# Patient Record
Sex: Female | Born: 1995 | Race: White | Hispanic: No | Marital: Married | Smoking: Never smoker
Health system: Southern US, Community
[De-identification: ages and names within clinical notes are randomized; demographics above are authoritative.]

## PROBLEM LIST (undated history)

## (undated) DIAGNOSIS — T7840XA Allergy, unspecified, initial encounter: Secondary | ICD-10-CM

## (undated) DIAGNOSIS — F419 Anxiety disorder, unspecified: Secondary | ICD-10-CM

## (undated) DIAGNOSIS — F32A Depression, unspecified: Secondary | ICD-10-CM

## (undated) DIAGNOSIS — F988 Other specified behavioral and emotional disorders with onset usually occurring in childhood and adolescence: Secondary | ICD-10-CM

## (undated) DIAGNOSIS — F329 Major depressive disorder, single episode, unspecified: Secondary | ICD-10-CM

## (undated) DIAGNOSIS — Z8619 Personal history of other infectious and parasitic diseases: Secondary | ICD-10-CM

## (undated) HISTORY — DX: Major depressive disorder, single episode, unspecified: F32.9

## (undated) HISTORY — DX: Anxiety disorder, unspecified: F41.9

## (undated) HISTORY — DX: Depression, unspecified: F32.A

## (undated) HISTORY — DX: Allergy, unspecified, initial encounter: T78.40XA

## (undated) HISTORY — DX: Other specified behavioral and emotional disorders with onset usually occurring in childhood and adolescence: F98.8

## (undated) HISTORY — DX: Personal history of other infectious and parasitic diseases: Z86.19

---
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
## 2016-07-11 LAB — HM PAP SMEAR: HM PAP: NEGATIVE

## 2016-10-24 LAB — CBC AND DIFFERENTIAL
HEMATOCRIT: 47 — AB (ref 36–46)
HEMOGLOBIN: 15.1 (ref 12.0–16.0)
Platelets: 196 (ref 150–399)
WBC: 7.9

## 2016-10-24 LAB — BASIC METABOLIC PANEL
BUN: 11 (ref 4–21)
Creatinine: 1 (ref 0.5–1.1)
GLUCOSE: 82
Potassium: 4.5 (ref 3.4–5.3)
SODIUM: 140 (ref 137–147)

## 2016-10-24 LAB — HEPATIC FUNCTION PANEL
ALT: 17 (ref 7–35)
AST: 19 (ref 13–35)
Alkaline Phosphatase: 60 (ref 25–125)
BILIRUBIN, TOTAL: 0.6

## 2016-10-24 LAB — VITAMIN B12: Vitamin B-12: 343

## 2016-10-26 ENCOUNTER — Encounter (HOSPITAL_COMMUNITY): Payer: Self-pay | Admitting: Emergency Medicine

## 2016-10-26 ENCOUNTER — Emergency Department (HOSPITAL_COMMUNITY): Payer: No Typology Code available for payment source

## 2016-10-26 ENCOUNTER — Emergency Department (HOSPITAL_COMMUNITY)
Admission: EM | Admit: 2016-10-26 | Discharge: 2016-10-26 | Disposition: A | Discharge status: Home / Self Care | Payer: No Typology Code available for payment source | Attending: Emergency Medicine | Admitting: Emergency Medicine

## 2016-10-26 ENCOUNTER — Encounter: Payer: Self-pay | Admitting: Physician Assistant

## 2016-10-26 ENCOUNTER — Ambulatory Visit (INDEPENDENT_AMBULATORY_CARE_PROVIDER_SITE_OTHER): Payer: No Typology Code available for payment source | Admitting: Physician Assistant

## 2016-10-26 VITALS — BP 90/60 | HR 76 | Temp 98.4°F | Ht 63.0 in | Wt 135.0 lb

## 2016-10-26 DIAGNOSIS — R1012 Left upper quadrant pain: Secondary | ICD-10-CM | POA: Diagnosis not present

## 2016-10-26 DIAGNOSIS — R55 Syncope and collapse: Secondary | ICD-10-CM | POA: Diagnosis not present

## 2016-10-26 DIAGNOSIS — Z79899 Other long term (current) drug therapy: Secondary | ICD-10-CM | POA: Insufficient documentation

## 2016-10-26 LAB — COMPREHENSIVE METABOLIC PANEL
ALT: 17 U/L (ref 14–54)
ANION GAP: 8 (ref 5–15)
AST: 21 U/L (ref 15–41)
Albumin: 4.4 g/dL (ref 3.5–5.0)
Alkaline Phosphatase: 68 U/L (ref 38–126)
BILIRUBIN TOTAL: 0.5 mg/dL (ref 0.3–1.2)
BUN: 11 mg/dL (ref 6–20)
CO2: 23 mmol/L (ref 22–32)
Calcium: 9.4 mg/dL (ref 8.9–10.3)
Chloride: 108 mmol/L (ref 101–111)
Creatinine, Ser: 0.74 mg/dL (ref 0.44–1.00)
GFR calc Af Amer: >60 mL/min (ref >60)
Glucose, Bld: 82 mg/dL (ref 65–99)
POTASSIUM: 3.8 mmol/L (ref 3.5–5.1)
Sodium: 139 mmol/L (ref 135–145)
TOTAL PROTEIN: 7.4 g/dL (ref 6.5–8.1)

## 2016-10-26 LAB — URINALYSIS, ROUTINE W REFLEX MICROSCOPIC
Bilirubin Urine: NEGATIVE
Glucose, UA: NEGATIVE mg/dL
Hgb urine dipstick: NEGATIVE
KETONES UR: NEGATIVE mg/dL
LEUKOCYTES UA: NEGATIVE
NITRITE: NEGATIVE
PH: 5 (ref 5.0–8.0)
Protein, ur: NEGATIVE mg/dL
SPECIFIC GRAVITY, URINE: 1.013 (ref 1.005–1.030)

## 2016-10-26 LAB — CBC
HEMATOCRIT: 44.2 % (ref 36.0–46.0)
HEMOGLOBIN: 15.2 g/dL — AB (ref 12.0–15.0)
MCH: 32.1 pg (ref 26.0–34.0)
MCHC: 34.4 g/dL (ref 30.0–36.0)
MCV: 93.2 fL (ref 78.0–100.0)
Platelets: 190 10*3/uL (ref 150–400)
RBC: 4.74 MIL/uL (ref 3.87–5.11)
RDW: 12.9 % (ref 11.5–15.5)
WBC: 8.4 10*3/uL (ref 4.0–10.5)

## 2016-10-26 LAB — IRON AND TIBC
IRON: 72
TIBC: 373
UIBC: 301

## 2016-10-26 LAB — LIPASE: Lipase: 21

## 2016-10-26 LAB — ESTIMATED GFR: EGFR (Non-African Amer.): 74.187

## 2016-10-26 LAB — LIPASE, BLOOD: LIPASE: 25 U/L (ref 11–51)

## 2016-10-26 LAB — FERRITIN: Ferritin: 53.1

## 2016-10-26 LAB — POC URINE PREG, ED: Preg Test, Ur: NEGATIVE

## 2016-10-26 LAB — AMYLASE: AMYLASE: 55

## 2016-10-26 LAB — FOLATE: Folate: 13.68

## 2016-10-26 MED ORDER — ONDANSETRON HCL 4 MG PO TABS: 4.0000 mg | ORAL_TABLET | Freq: Four times a day (QID) | ORAL | 0 refills | Status: DC

## 2016-10-26 MED ORDER — NAPROXEN 375 MG PO TABS: 375.0000 mg | ORAL_TABLET | Freq: Two times a day (BID) | ORAL | 0 refills | Status: DC

## 2016-10-26 MED ORDER — IOPAMIDOL (ISOVUE-300) INJECTION 61%
INTRAVENOUS | Status: AC
  Filled 2016-10-26: qty 100

## 2016-10-26 MED ORDER — IOPAMIDOL (ISOVUE-300) INJECTION 61%
100.0000 mL | Freq: Once | INTRAVENOUS | Status: AC | PRN
  Administered 2016-10-26: 100 mL via INTRAVENOUS

## 2016-10-26 MED ORDER — SODIUM CHLORIDE 0.9 % IV BOLUS (SEPSIS)
1000.0000 mL | Freq: Once | INTRAVENOUS | Status: AC
  Administered 2016-10-26: 1000 mL via INTRAVENOUS

## 2016-10-26 MED ORDER — ONDANSETRON HCL 4 MG/2ML IJ SOLN
4.0000 mg | Freq: Once | INTRAMUSCULAR | Status: AC
  Administered 2016-10-26: 4 mg via INTRAVENOUS
  Filled 2016-10-26: qty 2

## 2016-10-26 NOTE — Progress Notes (Signed)
Hannah Roman is a 21 y.o. female here to establish care and had near syncope episode on 9/3.  I acted as a Neurosurgeonscribe for [employer] CorporationSamantha Demauri Advincula, PA-C Corky Mullonna Orphanos, LPN  History of Present Illness:   Chief Complaint  Patient presents with  . Establish Care    PHCS Multiplan  . Near Syncope    Acute Concerns: Near syncope -- [place] [employer], had eaten cereal prior to, was walking into the park had completed climbing one set of stairs and then felt like she was going to pass out. Felt fine the day before and the morning of.  Had significant nausea and dizziness, saw "black." Within a few minutes her vision returned to normal. Since then, she has been having diarrhea 3 x both days after leaving the park. Eating well since then. Continued dizziness, lightheadedness and some SOB with walking. Has been sleeping more than usual. Having significant pain in LUQ region and epigastric. Pain is worse with laying down and any sudden movements, just as jumping. Having headaches -- when walking and first thing in the morning. No slurred speech, weakness along one side of body or other stroke-like symptoms with HA. Advil relieves the HA. No blood in stool. Has had intermittent nausea. Since this episode she has been trying to drink more than usual, has had 2 bottles of water + 2 bottles of gatorade daily.  She went to an Urgent Care on both the day of her syncopal event and the next day. On Labor Day, they checked a urine and told her to return the next day because they were "about to close for the day." Labs completed the next day have been faxed to us, reveal normal CMP, amylase, lipase, CBC normal. Hgb normal. Urine did show trace leuks.  Chronic Issues: None  Health Maintenance: Weight -- Weight: 135 lb (61.2 kg)   Depression screen [facility] 2/9 10/26/2016  Decreased Interest 0  Down, Depressed, Hopeless 0  PHQ - 2 Score 0    No flowsheet data found.  Other  providers/specialists: Ob-Gyn    Past Medical History:  Diagnosis Date  . History of chicken pox      Social History   Social History  . Marital status: Single    Spouse name: N/A  . Number of children: N/A  . Years of education: N/A   Occupational History  . Not on file.   Social History Main Topics  . Smoking status: Never Smoker  . Smokeless tobacco: Never Used  . Alcohol use Yes     Comment: socially  . Drug use: No  . Sexual activity: Yes    Birth control/ protection: Condom   Other Topics Concern  . Not on file   Social History Narrative   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[employer]   Has boyfriend       Past Surgical History:  Procedure Laterality Date  . 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 Depression Father   . High Cholesterol Father   . Hypertension Father   . Cancer Maternal Grandmother   . Depression Maternal Grandmother   . Cancer Paternal Grandmother   . Cancer Paternal Grandfather     Allergies  Allergen Reactions  . Penicillins Hives     Current Medications:   Current Outpatient Prescriptions:  .  ibuprofen (ADVIL) 200 MG tablet, Take 400 mg  by mouth every 6 (six) hours as needed., Disp: , Rfl:    Review of Systems:   Review of Systems  Constitutional: Positive for malaise/fatigue. Negative for chills, fever and weight loss.  HENT: Negative for hearing loss, sinus pain and sore throat.   Eyes: Negative for blurred vision.  Respiratory: Negative for cough and shortness of breath.   Cardiovascular: Negative for chest pain, palpitations and leg swelling.  Gastrointestinal: Positive for diarrhea and nausea. Negative for abdominal pain, constipation, heartburn and vomiting.       Abdominal cramping started Tues morning.  Genitourinary: Negative for dysuria, frequency and urgency.  Musculoskeletal: Negative for back pain, myalgias and neck pain.  Skin: Negative for itching  and rash.  Neurological: Positive for dizziness and headaches. Negative for tingling, seizures and loss of consciousness.  Endo/Heme/Allergies: Negative for polydipsia.  Psychiatric/Behavioral: Negative for depression. The patient is not nervous/anxious.     Vitals:   Vitals:   10/26/16 0840  BP: 90/60  Pulse: 76  Temp: 98.4 F (36.9 C)  TempSrc: Oral  SpO2: 99%  Weight: 135 lb (61.2 kg)  Height:  (1.6 m)     Body mass index is 23.91 kg/m.   Orthostatic VS for the past 24 hrs (Last 3 readings):  BP- Lying Pulse- Lying BP- Sitting Pulse- Sitting BP- Standing at 0 minutes Pulse- Standing at 0 minutes  10/26/16 0926 106/70 88 98/70 78 90/60 85    Physical Exam:   Physical Exam  Constitutional: She appears well-developed. She is cooperative.  Non-toxic appearance. She does not have a sickly appearance. She does not appear ill. No distress.  Cardiovascular: Normal rate, regular rhythm, S1 normal, S2 normal, normal heart sounds and normal pulses.   No LE edema  Pulmonary/Chest: Effort normal and breath sounds normal.  Abdominal: Normal appearance and bowel sounds are normal. There is tenderness in the epigastric area and left upper quadrant. There is guarding (LUQ). There is no rigidity, no rebound, no CVA tenderness, no tenderness at McBurney's point and negative Murphy's sign.  Endorses significant abdominal pain when asked to jump  Neurological: She is alert. She has normal strength. No cranial nerve deficit or sensory deficit. Coordination and gait normal. GCS eye subscore is 4. GCS verbal subscore is 5. GCS motor subscore is 6.  Reflex Scores:      Patellar reflexes are 2+ on the right side and 2+ on the left side. Skin: Skin is warm, dry and intact.  Psychiatric: She has a normal mood and affect. Her speech is normal and behavior is normal.  Nursing note and vitals reviewed.  EKG tracing is personally reviewed.  EKG notes NSR.  No acute changes.    Assessment and  Plan:    Mozel was seen today for establish care and near syncope.  Diagnoses and all orders for this visit:  Near syncope and left upper quadrant pain EKG tracing is personally reviewed.  EKG notes NSR.  No acute changes. Orthostatics negative. Blood pressure remains low for her. She has continued dizziness, headaches, fatigue, diarrhea and worsening LUQ and epigastric pain since her incident. We discussed option of ordering labs here, pushing PO fluids, and obtaining stat CT as an outpatient vs going to the ED for further evaluation and treatment. She is agreeable to going to the ER. Offered EMS transportation however she declined. She is planning to go to [facility] for further care.  -     EKG 12-Lead  Encounter for screening for  HIV -     Cancel: HIV antibody     . Reviewed expectations re: course of current medical issues. . Discussed self-management of symptoms. . Outlined signs and symptoms indicating need for more acute intervention. . Patient verbalized understanding and all questions were answered. . See orders for this visit as documented in the electronic medical record. . Patient received an After-Visit Summary.   CMA or LPN served as scribe during this visit. History, Physical, and Plan performed by medical provider. Documentation and orders reviewed and attested to.   Jarold Motto, PA-C

## 2016-10-26 NOTE — ED Provider Notes (Signed)
[facility]-EMERGENCY DEPT Provider Note   CSN: [id number] Arrival date & time: 10/26/16  1004     History   Chief Complaint Chief Complaint  Patient presents with  . Abdominal Pain    HPI Hannah Roman is a 21 y.o. female.  HPI Pt has been abdominal pain since Monday.  She had a syncopal episode on Monday.  She has been having trouble with dizziness, headaches, fatigue and lightheadedness since then.  She has been nauseated but no vomiting.  She has been having diarrhea, 2-3 times per day.  No cough, no sore throat.  No dysuria.  No vaginal bleeding or discharge.  She has pain in the llq. It increases with walking and moving. Past Medical History:  Diagnosis Date  . Depression   . History of chicken pox     There are no active problems to display for this patient.   Past Surgical History:  Procedure Laterality Date  . 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 Authorizing Provider  naproxen sodium (ANAPROX) 220 MG tablet Take 440 mg by mouth 2 (two) times daily with a meal.   Yes [provider]  naproxen (NAPROSYN) 375 MG tablet Take 1 tablet (375 mg total) by mouth 2 (two) times daily. 10/26/16   Linwood Dibbles, MD  ondansetron (ZOFRAN) 4 MG tablet Take 1 tablet (4 mg total) by mouth every 6 (six) hours. 10/26/16   Linwood Dibbles, MD    Family History Family History  Problem Relation Age of Onset  . Depression Father   . High Cholesterol Father   . Hypertension Father   . Cancer Maternal Grandmother   . Depression Maternal Grandmother   . Cancer Paternal Grandmother   . Cancer Paternal Grandfather     Social History Social History  Substance Use Topics  . Smoking status: Never Smoker  . Smokeless tobacco: Never Used  . Alcohol use Yes     Comment: socially     Allergies   Penicillins   Review  of Systems Review of Systems  All other systems reviewed and are negative.    Physical Exam Updated Vital Signs BP 104/72 (BP Location: Right Arm) Comment: Simultaneous filing. User may not have seen previous data.  Pulse 72 Comment: Simultaneous filing. User may not have seen previous data.  Temp 98 F (36.7 C)   Resp 20   LMP 09/25/2016 Comment: neg preg test 10/26/16  SpO2 100%   Physical Exam  Constitutional: She appears well-developed and well-nourished. No distress.  HENT:  Head: Normocephalic and atraumatic.  Right Ear: External ear normal.  Left Ear: External ear normal.  Eyes: Conjunctivae are normal. Right eye exhibits no discharge. Left eye exhibits no discharge. No scleral icterus.  Neck: Neck supple. No tracheal deviation present.  Cardiovascular: Normal rate, regular rhythm and intact distal pulses.   Pulmonary/Chest: Effort normal and breath sounds normal. No stridor. No respiratory distress. She has no wheezes. She has no rales.  Abdominal: Soft. Bowel sounds are normal. She exhibits no distension. There is tenderness in the left upper quadrant. There is no rebound and no guarding. No hernia.  Musculoskeletal: She exhibits no edema or tenderness.  Neurological: She is alert. She has normal strength. No cranial nerve deficit (no facial droop, extraocular movements  intact, no slurred speech) or sensory deficit. She exhibits normal muscle tone. She displays no seizure activity. Coordination normal.  Skin: Skin is warm and dry. No rash noted.  Psychiatric: She has a normal mood and affect.  Nursing note and vitals reviewed.    ED Treatments / Results  Labs (all labs ordered are listed, but only abnormal results are displayed) Labs Reviewed  CBC - Abnormal; Notable for the following:       Result Value   Hemoglobin 15.2 (*)    All other components within normal limits  LIPASE, BLOOD  COMPREHENSIVE METABOLIC PANEL  URINALYSIS, ROUTINE W REFLEX MICROSCOPIC  POC  URINE PREG, ED     Radiology Ct Abdomen Pelvis W Contrast  Result Date: 10/26/2016 CLINICAL DATA:  21 year old female with a history of left-sided abdominal pain for 2 days EXAM: CT ABDOMEN AND PELVIS WITH CONTRAST TECHNIQUE: Multidetector CT imaging of the abdomen and pelvis was performed using the standard protocol following bolus administration of intravenous contrast. CONTRAST:  ISOVUE-300 IOPAMIDOL (ISOVUE-300) INJECTION 61% COMPARISON:  None. FINDINGS: Lower chest: No acute abnormality. Hepatobiliary: No focal liver abnormality is seen. No gallstones, gallbladder wall thickening, or biliary dilatation. Pancreas: Unremarkable. No pancreatic ductal dilatation or surrounding inflammatory changes. Spleen: Normal in size without focal abnormality. Adrenals/Urinary Tract: Unremarkable appearance of the adrenal glands. Right kidney unremarkable with no hydronephrosis or nephrolithiasis. No right-sided perinephric stranding. Unremarkable course of the right ureter. The left kidney demonstrates a few regions of cortical thinning involving superior cortex, posterior cortex, and inferior cortex. No inflammatory changes. No hydronephrosis or nephrolithiasis. Unremarkable course of the left ureter. There is a 6 mm hyperdense focus at the dependent aspect of the urinary bladder, to the right of the midline. Stomach/Bowel: Unremarkable stomach, small bowel, colon. Normal appendix. Vascular/Lymphatic: Unremarkable appearance of the vasculature. Reproductive: Small amount of fluid within the endometrial canal. Likely physiologic changes of the bilateral at adnexa. Other: No abdominal wall hernia. Musculoskeletal: No displaced fracture. IMPRESSION: There are no CT findings to account for the patient's left-sided abdominal pain. There is a 6 mm hyperdense focus at the dependent aspect of the urinary bladder, which may represent a bladder stone or recently passed kidney stone. No evidence of left or right-sided  nephrolithiasis or hydronephrosis. The left kidney demonstrates a few regions of cortical thinning/ scarring. This may reflect prior episodes of infection or infarction, and a correlation with prior history of UTI and/ or pyelonephritis may be useful. Physiologic changes of the uterus and adnexa. Electronically Signed   By: Gilmer Mor D.O.   On: 10/26/2016 16:25    Procedures Procedures (including critical care time)  Medications Ordered in ED Medications  iopamidol (ISOVUE-300) 61 % injection (not administered)  sodium chloride 0.9 % bolus 1,000 mL (1,000 mLs Intravenous New Bag/Given 10/26/16 1617)  ondansetron (ZOFRAN) injection 4 mg (4 mg Intravenous Given 10/26/16 1618)  iopamidol (ISOVUE-300) 61 % injection 100 mL (100 mLs Intravenous Contrast Given 10/26/16 1602)     Initial Impression / Assessment and Plan / ED Course  I have reviewed the triage vital signs and the nursing notes.  Pertinent labs & imaging results that were available during my care of the patient were reviewed by me and considered in my medical decision making (see chart for details).  Clinical Course as of Oct 26 1721  Thu Oct 26, 2016  1702 No acute findings on ct scan.  Possible recently passed stone?  [JK]    Clinical Course User Index [JK]  Linwood DibblesKnapp, Naia Ruff, MD   Pt presents with a few days of abdominal pain. Labs are reassuring. Persistent pain in the luq.  CT scan done to evaluate further.  No acute findings noted.   It is possible she recently passed a stone.  Dc home with NSAID and antinausea medications.  Follow up with PCP  Final Clinical Impressions(s) / ED Diagnoses   Final diagnoses:  Left upper quadrant pain    New Prescriptions New Prescriptions   NAPROXEN (NAPROSYN) 375 MG TABLET    Take 1 tablet (375 mg total) by mouth 2 (two) times daily.   ONDANSETRON (ZOFRAN) 4 MG TABLET    Take 1 tablet (4 mg total) by mouth every 6 (six) hours.     Linwood DibblesKnapp, Clancy Leiner, MD 10/26/16 [phone]

## 2016-10-26 NOTE — ED Notes (Signed)
ED Provider at bedside. 

## 2016-10-26 NOTE — Patient Instructions (Signed)
[facility]  Tell them you are having severe abdominal pain -- hurts to walk, move and lay down, and it is getting worse with time. Tell them you have been having intermittent dizziness with headaches and that your blood pressure has been abnormally low. You will likely need IV fluids and they can administer them there.  Follow-up with us at your discretion after your visit to the emergency department.

## 2016-10-26 NOTE — ED Triage Notes (Signed)
Pt reports L side abd pain for the past 2 days. Went to pcp for same, and was told to come here for further eval. No emesis. Has had nausea and diarrhea. Has had diarrhea 3x in past 24 hours.

## 2016-10-26 NOTE — ED Notes (Signed)
Discharge instructions reviewed with patient. Patient verbalizes understanding. VSS.   

## 2016-11-03 ENCOUNTER — Telehealth: Payer: Self-pay | Admitting: Physician Assistant

## 2016-11-03 NOTE — Telephone Encounter (Signed)
ROI faxed to [employer] @ [place]

## 2016-11-30 ENCOUNTER — Encounter: Payer: Self-pay | Admitting: Physician Assistant

## 2016-11-30 LAB — GC/CHLAMYDIA PROBE AMP
CHLAMYDIA BY NAA: NEGATIVE
Chlamydia by NAA: NEGATIVE
GC PROBE AMP, URINE: NEGATIVE
Neisseria Gonorrhea: NEGATIVE

## 2016-12-01 ENCOUNTER — Encounter: Payer: Self-pay | Admitting: Physician Assistant

## 2016-12-01 DIAGNOSIS — F329 Major depressive disorder, single episode, unspecified: Secondary | ICD-10-CM | POA: Insufficient documentation

## 2016-12-01 DIAGNOSIS — F419 Anxiety disorder, unspecified: Secondary | ICD-10-CM | POA: Insufficient documentation

## 2016-12-01 DIAGNOSIS — F32A Depression, unspecified: Secondary | ICD-10-CM | POA: Insufficient documentation

## 2016-12-07 DIAGNOSIS — R51 Headache: Secondary | ICD-10-CM | POA: Insufficient documentation

## 2016-12-07 DIAGNOSIS — Y92410 Unspecified street and highway as the place of occurrence of the external cause: Secondary | ICD-10-CM | POA: Diagnosis not present

## 2016-12-07 DIAGNOSIS — R071 Chest pain on breathing: Secondary | ICD-10-CM | POA: Diagnosis not present

## 2016-12-07 DIAGNOSIS — Y999 Unspecified external cause status: Secondary | ICD-10-CM | POA: Insufficient documentation

## 2016-12-07 DIAGNOSIS — M25551 Pain in right hip: Secondary | ICD-10-CM | POA: Diagnosis not present

## 2016-12-07 DIAGNOSIS — Y9389 Activity, other specified: Secondary | ICD-10-CM | POA: Insufficient documentation

## 2016-12-07 DIAGNOSIS — M546 Pain in thoracic spine: Secondary | ICD-10-CM | POA: Insufficient documentation

## 2016-12-07 DIAGNOSIS — S8001XA Contusion of right knee, initial encounter: Secondary | ICD-10-CM | POA: Insufficient documentation

## 2016-12-07 DIAGNOSIS — S161XXA Strain of muscle, fascia and tendon at neck level, initial encounter: Secondary | ICD-10-CM | POA: Diagnosis not present

## 2016-12-07 DIAGNOSIS — S199XXA Unspecified injury of neck, initial encounter: Secondary | ICD-10-CM | POA: Diagnosis present

## 2016-12-07 DIAGNOSIS — S20312A Abrasion of left front wall of thorax, initial encounter: Secondary | ICD-10-CM | POA: Insufficient documentation

## 2016-12-07 MED ORDER — IBUPROFEN 400 MG PO TABS
ORAL_TABLET | ORAL | Status: AC
  Administered 2016-12-07: 400 mg via ORAL
  Filled 2016-12-07: qty 1

## 2016-12-07 MED ORDER — IBUPROFEN 400 MG PO TABS
400.0000 mg | ORAL_TABLET | Freq: Once | ORAL | Status: AC | PRN
  Administered 2016-12-07: 400 mg via ORAL
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
## 2016-12-08 ENCOUNTER — Emergency Department (HOSPITAL_COMMUNITY): Payer: No Typology Code available for payment source

## 2016-12-08 ENCOUNTER — Emergency Department (HOSPITAL_COMMUNITY)
Admission: EM | Admit: 2016-12-08 | Discharge: 2016-12-08 | Disposition: A | Discharge status: Home / Self Care | Payer: No Typology Code available for payment source | Attending: Emergency Medicine | Admitting: Emergency Medicine

## 2016-12-08 DIAGNOSIS — S20312A Abrasion of left front wall of thorax, initial encounter: Secondary | ICD-10-CM

## 2016-12-08 DIAGNOSIS — S161XXA Strain of muscle, fascia and tendon at neck level, initial encounter: Secondary | ICD-10-CM

## 2016-12-08 DIAGNOSIS — S8001XA Contusion of right knee, initial encounter: Secondary | ICD-10-CM

## 2016-12-08 DIAGNOSIS — M546 Pain in thoracic spine: Secondary | ICD-10-CM

## 2016-12-08 LAB — I-STAT CHEM 8, ED
BUN: 10 mg/dL (ref 6–20)
CALCIUM ION: 1.24 mmol/L (ref 1.15–1.40)
CHLORIDE: 105 mmol/L (ref 101–111)
CREATININE: 0.7 mg/dL (ref 0.44–1.00)
GLUCOSE: 104 mg/dL — AB (ref 65–99)
HCT: 41 % (ref 36.0–46.0)
Hemoglobin: 13.9 g/dL (ref 12.0–15.0)
Potassium: 3.2 mmol/L — ABNORMAL LOW (ref 3.5–5.1)
Sodium: 142 mmol/L (ref 135–145)
TCO2: 23 mmol/L (ref 22–32)

## 2016-12-08 LAB — I-STAT BETA HCG BLOOD, ED (MC, WL, AP ONLY)

## 2016-12-08 MED ORDER — METHOCARBAMOL 500 MG PO TABS: 500.0000 mg | ORAL_TABLET | Freq: Two times a day (BID) | ORAL | 0 refills | Status: DC | PRN

## 2016-12-08 MED ORDER — IOPAMIDOL (ISOVUE-300) INJECTION 61%
INTRAVENOUS | Status: AC
  Administered 2016-12-08: 75 mL
  Filled 2016-12-08: qty 75

## 2016-12-08 MED ORDER — IBUPROFEN 600 MG PO TABS: 600.0000 mg | ORAL_TABLET | Freq: Four times a day (QID) | ORAL | 0 refills | Status: DC | PRN

## 2016-12-08 MED ORDER — SODIUM CHLORIDE 0.9 % IV BOLUS (SEPSIS)
1000.0000 mL | Freq: Once | INTRAVENOUS | Status: AC
  Administered 2016-12-08: 1000 mL via INTRAVENOUS
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
## 2017-01-04 ENCOUNTER — Telehealth: Payer: Self-pay | Admitting: Physician Assistant

## 2017-01-04 ENCOUNTER — Ambulatory Visit: Payer: No Typology Code available for payment source | Admitting: Family Medicine
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
## 2017-01-04 NOTE — Telephone Encounter (Signed)
Pt was on hold [place] came back and discussed pt with me, told Diannia RuderKara to schedule pt to come in today with Dr. Jimmey RalphParker. Diannia RuderKara said appt scheduled for 2:40 with Dr. Jimmey RalphParker.

## 2017-01-04 NOTE — Telephone Encounter (Signed)
Called and spoke to pt, asked her how she was feeling any heart palpitations now? Pt said no she only gets them when she has panic attacks along with SOB. Pt said she has had 4 panic attacks this week and her anxiety it is starting to inter fear with school. Pt said this is not an urgent matter and she is fine right now and would prefer to see her PCP. Told pt okay I will cancel appt this afternoon with Jimmey RalphParker and what is a good time to schedule [facility] I have 8:00, 8:30, 9:00 or afternoon? Pt said 8:00 AM. Told pt okay see you then if you develop severe SOB or chest pains please go to the ED. Pt verbalized understanding.

## 2017-01-04 NOTE — Telephone Encounter (Signed)
Noted, see other message. Pt coming in tomorrow to see PCP.
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
## 2017-01-05 ENCOUNTER — Encounter: Payer: Self-pay | Admitting: Physician Assistant

## 2017-01-05 ENCOUNTER — Ambulatory Visit (INDEPENDENT_AMBULATORY_CARE_PROVIDER_SITE_OTHER): Payer: No Typology Code available for payment source | Admitting: Physician Assistant

## 2017-01-05 VITALS — BP 100/60 | HR 87 | Temp 98.3°F | Ht 64.0 in | Wt 135.0 lb

## 2017-01-05 DIAGNOSIS — Z114 Encounter for screening for human immunodeficiency virus [HIV]: Secondary | ICD-10-CM | POA: Diagnosis not present

## 2017-01-05 DIAGNOSIS — F419 Anxiety disorder, unspecified: Secondary | ICD-10-CM | POA: Diagnosis not present

## 2017-01-05 LAB — CBC WITH DIFFERENTIAL/PLATELET
BASOS PCT: 0.7 % (ref 0.0–3.0)
Basophils Absolute: 0 10*3/uL (ref 0.0–0.1)
EOS PCT: 3.5 % (ref 0.0–5.0)
Eosinophils Absolute: 0.2 10*3/uL (ref 0.0–0.7)
HCT: 44.7 % (ref 36.0–46.0)
Hemoglobin: 15.1 g/dL — ABNORMAL HIGH (ref 12.0–15.0)
LYMPHS ABS: 2 10*3/uL (ref 0.7–4.0)
Lymphocytes Relative: 32.7 % (ref 12.0–46.0)
MCHC: 33.8 g/dL (ref 30.0–36.0)
MCV: 96.2 fl (ref 78.0–100.0)
MONO ABS: 0.5 10*3/uL (ref 0.1–1.0)
MONOS PCT: 8.4 % (ref 3.0–12.0)
NEUTROS ABS: 3.3 10*3/uL (ref 1.4–7.7)
NEUTROS PCT: 54.7 % (ref 43.0–77.0)
Platelets: 202 10*3/uL (ref 150.0–400.0)
RBC: 4.65 Mil/uL (ref 3.87–5.11)
RDW: 13.2 % (ref 11.5–15.5)
WBC: 6.1 10*3/uL (ref 4.0–10.5)

## 2017-01-05 LAB — COMPREHENSIVE METABOLIC PANEL
ALK PHOS: 57 U/L (ref 39–117)
ALT: 16 U/L (ref 0–35)
AST: 17 U/L (ref 0–37)
Albumin: 4.4 g/dL (ref 3.5–5.2)
BUN: 14 mg/dL (ref 6–23)
CHLORIDE: 105 meq/L (ref 96–112)
CO2: 27 mEq/L (ref 19–32)
Calcium: 9.9 mg/dL (ref 8.4–10.5)
Creatinine, Ser: 0.79 mg/dL (ref 0.40–1.20)
GFR: 97.18 mL/min (ref >60.00)
GLUCOSE: 89 mg/dL (ref 70–99)
POTASSIUM: 4.1 meq/L (ref 3.5–5.1)
Sodium: 140 mEq/L (ref 135–145)
TOTAL PROTEIN: 7 g/dL (ref 6.0–8.3)
Total Bilirubin: 0.6 mg/dL (ref 0.2–1.2)

## 2017-01-05 LAB — TSH: TSH: 0.78 u[IU]/mL (ref 0.35–4.50)

## 2017-01-05 MED ORDER — ESCITALOPRAM OXALATE 10 MG PO TABS: 10.0000 mg | ORAL_TABLET | Freq: Every day | ORAL | 1 refills | Status: DC

## 2017-01-05 MED ORDER — CLONAZEPAM 0.5 MG PO TABS: 0.5000 mg | ORAL_TABLET | Freq: Two times a day (BID) | ORAL | 0 refills | Status: DC | PRN
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
## 2017-01-05 NOTE — Patient Instructions (Signed)
It was great to see you!  Start Lexapro 10 mg daily.  Take Klonopin as needed for panic attacks only, may cause excessive sedation so do not drive on this medication.  We will call you with your lab results.  Follow-up with Dr. Helane RimaErica Wallace while I am out.

## 2017-01-06 LAB — HIV ANTIBODY (ROUTINE TESTING W REFLEX): HIV: NONREACTIVE

## 2017-01-08 NOTE — Telephone Encounter (Signed)
[facility] [place]

## 2017-01-09 NOTE — Progress Notes (Signed)
Labs all normal. Please inform patient.  Katina Degreealeb M. Jimmey RalphParker, MD 01/09/2017 11:14 AM

## 2017-01-15 ENCOUNTER — Encounter: Payer: Self-pay | Admitting: Physician Assistant

## 2017-01-15 LAB — GC/CHLAMYDIA PROBE AMP, URINE
CHLAMYDIA PROBE AMP: NEGATIVE
GC PROBE AMP, URINE: NEGATIVE

## 2017-02-16 ENCOUNTER — Ambulatory Visit: Payer: No Typology Code available for payment source | Admitting: Family Medicine

## 2017-02-22 ENCOUNTER — Ambulatory Visit: Payer: No Typology Code available for payment source | Admitting: Family Medicine

## 2017-02-28 ENCOUNTER — Encounter: Payer: Self-pay | Admitting: Family Medicine

## 2017-02-28 ENCOUNTER — Ambulatory Visit (INDEPENDENT_AMBULATORY_CARE_PROVIDER_SITE_OTHER): Payer: No Typology Code available for payment source | Admitting: Family Medicine

## 2017-02-28 VITALS — BP 104/66 | HR 77 | Temp 98.2°F | Ht 64.0 in | Wt 133.8 lb

## 2017-02-28 DIAGNOSIS — F329 Major depressive disorder, single episode, unspecified: Secondary | ICD-10-CM | POA: Diagnosis not present

## 2017-02-28 DIAGNOSIS — F419 Anxiety disorder, unspecified: Secondary | ICD-10-CM | POA: Diagnosis not present

## 2017-02-28 MED ORDER — ALPRAZOLAM 0.5 MG PO TABS: 0.5000 mg | ORAL_TABLET | Freq: Every day | ORAL | 0 refills | Status: DC | PRN

## 2017-02-28 MED ORDER — ESCITALOPRAM OXALATE 10 MG PO TABS: 10.0000 mg | ORAL_TABLET | Freq: Every day | ORAL | 1 refills | Status: DC

## 2017-02-28 NOTE — Patient Instructions (Signed)
It was so nice to meet you!  Follow up with Samantha in 6 months.

## 2017-02-28 NOTE — Progress Notes (Signed)
Hannah Roman is a 22 y.o. female is here for follow up.  History of Present Illness:   HPI: See Assessment and Plan section for Problem Based Charting of issues discussed today.  There are no preventive care reminders to display for this patient.   GAD 7 : Generalized Anxiety Score 02/28/2017 01/05/2017  Nervous, Anxious, on Edge 3 2  Control/stop worrying 3 3  Worry too much - different things 3 3  Trouble relaxing 3 3  Restless 2 1  Easily annoyed or irritable 2 3  Afraid - awful might happen 2 3  Total GAD 7 Score 18 18  Anxiety Difficulty - Somewhat difficult   Depression screen [facility] 2/9 02/28/2017 10/26/2016  Decreased Interest 2 0  Down, Depressed, Hopeless 3 0  PHQ - 2 Score 5 0  Altered sleeping 2 -  Tired, decreased energy 2 -  Change in appetite 0 -  Feeling bad or failure about yourself  3 -  Trouble concentrating 2 -  Moving slowly or fidgety/restless 2 -  Suicidal thoughts 0 -  PHQ-9 Score 16 -   PMHx, SurgHx, SocialHx, FamHx, Medications, and Allergies were reviewed in the Visit Navigator and updated as appropriate.   Patient Active Problem List   Diagnosis Date Noted  . Anxiety   . Depression    Social History   Tobacco Use  . Smoking status: Never Smoker  . Smokeless tobacco: Never Used  Substance Use Topics  . Alcohol use: Yes    Comment: socially  . Drug use: No   Current Medications and Allergies:   .  escitalopram (LEXAPRO) 10 MG tablet, Take 1 tablet (10 mg total) by mouth at bedtime., Disp: 90 tablet, Rfl: 1   Allergies  Allergen Reactions  . Penicillins Hives    Childhood reaction    Review of Systems   Pertinent items are noted in the HPI. Otherwise, ROS is negative.  Vitals:   Vitals:   02/28/17 0844  BP: 104/66  Pulse: 77  Temp: 98.2 F (36.8 C)  TempSrc: Oral  SpO2: 99%  Weight: 133 lb 12.8 oz (60.7 kg)  Height: 5\' 4"  (1.626 m)     Body mass index is 22.97 kg/m.   Physical Exam:   Physical Exam    Constitutional: She is oriented to person, place, and time. She appears well-developed and well-nourished. No distress.  HENT:  Head: Normocephalic and atraumatic.  Right Ear: External ear normal.  Left Ear: External ear normal.  Nose: Nose normal.  Mouth/Throat: Oropharynx is clear and moist.  Eyes: Conjunctivae and EOM are normal. Pupils are equal, round, and reactive to light.  Neck: Normal range of motion. Neck supple. No thyromegaly present.  Cardiovascular: Normal rate, regular rhythm, normal heart sounds and intact distal pulses.  Pulmonary/Chest: Effort normal and breath sounds normal.  Abdominal: Soft. Bowel sounds are normal.  Musculoskeletal: Normal range of motion.  Lymphadenopathy:    She has no cervical adenopathy.  Neurological: She is alert and oriented to person, place, and time.  Skin: Skin is warm and dry. Capillary refill takes less than 2 seconds.  Psychiatric: She has a normal mood and affect. Her behavior is normal.  Nursing note and vitals reviewed.   Assessment and Plan:   Hannah Roman was seen today for follow-up.  Diagnoses and all orders for this visit:  Anxiety and depression Comments: HPI: Patient follows up for medication visit for anxiety and depression.  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  At her last visit with Jarold MottoSamantha Worley, she scored high on PHQ 9 and gad 7 questionnaires.  Lexapro was started every morning.  Klonopin was provided for panic attacks to be used sparingly.  The patient states that the Lexapro has been helpful, but causes some sedation throughout the day.  Her depressive symptoms have improved somewhat.  See PHQ 9 above.  She admits that her issues are worse during the school year.  She did try the Klonopin for panic attacks but felt that it was too sedating for her.  Because dizziness, sedation, and lasted through the day.  She was given Xanax in the past and this was noted at  the last visit.  She states that 10 pills would last at least 6 months.  Assessment and plan: Improvement in symptoms.  Patient will start taking Lexapro at night.  I did give her a Xanax prescription of 20 pills.  She will keep half in her purse and half at home.  She has a self-directed goal of having this last 1 year. Orders: -     escitalopram (LEXAPRO) 10 MG tablet; Take 1 tablet (10 mg total) by mouth at bedtime. -     ALPRAZolam (XANAX) 0.5 MG tablet; Take 1 tablet (0.5 mg total) by mouth daily as needed for anxiety.  . Reviewed expectations re: course of current medical issues. . Discussed self-management of symptoms. . Outlined signs and symptoms indicating need for more acute intervention. . Patient verbalized understanding and all questions were answered. Marland Kitchen. Health Maintenance issues including appropriate healthy diet, exercise, and smoking avoidance were discussed with patient. . See orders for this visit as documented in the electronic medical record. . Patient received an After Visit Summary.  Helane RimaErica Brandan Glauber, DO [place] [place] 02/28/2017  Future Appointments  Date Time Provider Department Center  08/28/2017  9:00 AM Helane RimaWallace, Shantil Vallejo, DO [facility]   Records requested if needed. Time spent with the patient: 25 minutes, of which >50% was spent in obtaining information about her symptoms, reviewing her previous labs, evaluations, and treatments, counseling her about her condition (please see the discussed topics above), and developing a plan to further investigate it; she had a number of questions which I addressed.

## 2017-08-27 NOTE — Progress Notes (Deleted)
   Hannah Roman is a 22 y.o. female is here for follow up.  History of Present Illness:   HPI: There are no preventive care reminders to display for this patient. [facility] 02/28/2017 10/26/2016  Decreased Interest 2 0  Down, Depressed, Hopeless 3 0  PHQ - 2 Score 5 0  Altered sleeping 2 -  Tired, decreased energy 2 -  Change in appetite 0 -  Feeling bad or failure about yourself  3 -  Trouble concentrating 2 -  Moving slowly or fidgety/restless 2 -  Suicidal thoughts 0 -  PHQ-9 Score 16 -   PMHx, SurgHx, SocialHx, FamHx, Medications, and Allergies were reviewed in the Visit Navigator and updated as appropriate.   Patient Active Problem List   Diagnosis Date Noted  . Anxiety   . Depression    Social History   Tobacco Use  . Smoking status: Never Smoker  . Smokeless tobacco: Never Used  Substance Use Topics  . Alcohol use: Yes    Comment: socially  . Drug use: No   Current Medications and Allergies:   Current Outpatient Medications:  .  ALPRAZolam (XANAX) 0.5 MG tablet, Take 1 tablet (0.5 mg total) by mouth daily as needed for anxiety., Disp: 20 tablet, Rfl: 0 .  escitalopram (LEXAPRO) 10 MG tablet, Take 1 tablet (10 mg total) by mouth at bedtime., Disp: 90 tablet, Rfl: 1  Allergies  Allergen Reactions  . Penicillins Hives    Childhood reaction    Review of Systems   Pertinent items are noted in the HPI. Otherwise, ROS is negative.  Vitals:  There were no vitals filed for this visit.   There is no height or weight on file to calculate BMI.  Physical Exam:   Physical Exam Results for orders placed or performed in visit on 01/15/17  HM PAP SMEAR  Result Value Ref Range   HM Pap smear NILM/ Negative   GC/chlamydia probe amp, urine  Result Value Ref Range   GC Probe Amp, Urine Negative    Chlamydia Probe Amp Negative     Assessment and Plan:   There are no diagnoses linked to this encounter.  . Reviewed expectations re: course of  current medical issues. . Discussed self-management of symptoms. . Outlined signs and symptoms indicating need for more acute intervention. . Patient verbalized understanding and all questions were answered. Marland Kitchen. Health Maintenance issues including appropriate healthy diet, exercise, and smoking avoidance were discussed with patient. . See orders for this visit as documented in the electronic medical record. . Patient received an After Visit Summary.  Helane RimaErica Rayen Palen, DO [place] 08/27/2017  Future Appointments  Date Time Provider Department Center  08/28/2017  4:20 PM Helane RimaWallace, Gurveer Colucci, DO [facility]    *** CMA served as scribe during this visit. History, Physical, and Plan performed by medical provider. The above documentation has been reviewed and is accurate and complete. Helane RimaErica Beckham Capistran, D.O.

## 2017-08-28 ENCOUNTER — Ambulatory Visit: Payer: No Typology Code available for payment source | Admitting: Family Medicine

## 2017-09-04 ENCOUNTER — Ambulatory Visit: Payer: No Typology Code available for payment source | Admitting: Family Medicine

## 2017-09-04 NOTE — Progress Notes (Deleted)
   Hannah Roman is a 22 y.o. female is here for follow up.  History of Present Illness:   HPI: There are no preventive care reminders to display for this patient. [facility] 02/28/2017 10/26/2016  Decreased Interest 2 0  Down, Depressed, Hopeless 3 0  PHQ - 2 Score 5 0  Altered sleeping 2 -  Tired, decreased energy 2 -  Change in appetite 0 -  Feeling bad or failure about yourself  3 -  Trouble concentrating 2 -  Moving slowly or fidgety/restless 2 -  Suicidal thoughts 0 -  PHQ-9 Score 16 -   PMHx, SurgHx, SocialHx, FamHx, Medications, and Allergies were reviewed in the Visit Navigator and updated as appropriate.   Patient Active Problem List   Diagnosis Date Noted  . Anxiety   . Depression    Social History   Tobacco Use  . Smoking status: Never Smoker  . Smokeless tobacco: Never Used  Substance Use Topics  . Alcohol use: Yes    Comment: socially  . Drug use: No   Current Medications and Allergies:   Current Outpatient Medications:  .  ALPRAZolam (XANAX) 0.5 MG tablet, Take 1 tablet (0.5 mg total) by mouth daily as needed for anxiety., Disp: 20 tablet, Rfl: 0 .  escitalopram (LEXAPRO) 10 MG tablet, Take 1 tablet (10 mg total) by mouth at bedtime., Disp: 90 tablet, Rfl: 1  Allergies  Allergen Reactions  . Penicillins Hives    Childhood reaction    Review of Systems   Pertinent items are noted in the HPI. Otherwise, ROS is negative.  Vitals:  There were no vitals filed for this visit.   There is no height or weight on file to calculate BMI.  Physical Exam:   Physical Exam Results for orders placed or performed in visit on 01/15/17  HM PAP SMEAR  Result Value Ref Range   HM Pap smear NILM/ Negative   GC/chlamydia probe amp, urine  Result Value Ref Range   GC Probe Amp, Urine Negative    Chlamydia Probe Amp Negative     Assessment and Plan:   There are no diagnoses linked to this encounter.  . Reviewed expectations re: course of  current medical issues. . Discussed self-management of symptoms. . Outlined signs and symptoms indicating need for more acute intervention. . Patient verbalized understanding and all questions were answered. Marland Kitchen. Health Maintenance issues including appropriate healthy diet, exercise, and smoking avoidance were discussed with patient. . See orders for this visit as documented in the electronic medical record. . Patient received an After Visit Summary.  Helane RimaErica Johnattan Strassman, DO [place] 09/04/2017  Future Appointments  Date Time Provider Department Center  09/04/2017  4:20 PM Helane RimaWallace, Lakyn Mantione, DO [facility]    *** CMA served as scribe during this visit. History, Physical, and Plan performed by medical provider. The above documentation has been reviewed and is accurate and complete. Helane RimaErica Anjelika Ausburn, D.O.

## 2017-09-07 ENCOUNTER — Ambulatory Visit (INDEPENDENT_AMBULATORY_CARE_PROVIDER_SITE_OTHER): Payer: No Typology Code available for payment source | Admitting: Family Medicine

## 2017-09-07 ENCOUNTER — Encounter: Payer: Self-pay | Admitting: Family Medicine

## 2017-09-07 VITALS — BP 102/64 | HR 93 | Temp 99.3°F | Ht 64.0 in | Wt 132.0 lb

## 2017-09-07 DIAGNOSIS — F419 Anxiety disorder, unspecified: Secondary | ICD-10-CM | POA: Diagnosis not present

## 2017-09-07 DIAGNOSIS — F341 Dysthymic disorder: Secondary | ICD-10-CM | POA: Diagnosis not present

## 2017-09-07 MED ORDER — FLUOXETINE HCL 20 MG PO CAPS: 20.0000 mg | ORAL_CAPSULE | Freq: Every day | ORAL | 3 refills | Status: DC

## 2017-09-07 NOTE — Progress Notes (Signed)
Hannah Roman is a 22 y.o. female is here for follow up.  History of Present Illness:   Hannah Roman, CMA acting as scribe for Dr. Helane RimaErica Soua Roman.   HPI: Patient in office for follow up. She had to stop the lexapro. It gave her abdominal pain and she felt "very low" on it. Symptoms stopped after she discontinued it. She does take the xanax as needed and it helps a lot. She does not need refill at this time still has about five from last prescription.   There are no preventive care reminders to display for this patient.   [facility] 09/07/2017 02/28/2017 10/26/2016  Decreased Interest 1 2 0  Down, Depressed, Hopeless 1 3 0  PHQ - 2 Score 2 5 0  Altered sleeping 1 2 -  Tired, decreased energy 1 2 -  Change in appetite 0 0 -  Feeling bad or failure about yourself  1 3 -  Trouble concentrating 2 2 -  Moving slowly or fidgety/restless 0 2 -  Suicidal thoughts 0 0 -  PHQ-9 Score 7 16 -  Difficult doing work/chores Very difficult - -   PMHx, SurgHx, SocialHx, FamHx, Medications, and Allergies were reviewed in the Visit Navigator and updated as appropriate.   Patient Active Problem List   Diagnosis Date Noted  . Anxiety   . Depression    Social History   Tobacco Use  . Smoking status: Never Smoker  . Smokeless tobacco: Never Used  Substance Use Topics  . Alcohol use: Yes    Comment: socially  . Drug use: No   Current Medications and Allergies:   Current Outpatient Medications:  .  ALPRAZolam (XANAX) 0.5 MG tablet, Take 1 tablet (0.5 mg total) by mouth daily as needed for anxiety., Disp: 20 tablet, Rfl: 0 .  FLUoxetine (PROZAC) 20 MG capsule, Take 1 capsule (20 mg total) by mouth at bedtime., Disp: 90 capsule, Rfl: 3   Allergies  Allergen Reactions  . Penicillins Hives    Childhood reaction   . Lexapro [Escitalopram Oxalate]     GI symptoms    Review of Systems   Pertinent items are noted in the HPI. Otherwise, ROS is negative.  Vitals:    Vitals:   09/07/17 1423  BP: 102/64  Pulse: 93  Temp: 99.3 F (37.4 C)  TempSrc: Oral  SpO2: 99%  Weight: 132 lb (59.9 kg)  Height: 5\' 4"  (1.626 m)     Body mass index is 22.66 kg/m.  Physical Exam:   Physical Exam  Constitutional: She appears well-nourished.  HENT:  Head: Normocephalic and atraumatic.  Eyes: Pupils are equal, round, and reactive to light. EOM are normal.  Neck: Normal range of motion. Neck supple.  Cardiovascular: Normal rate, regular rhythm, normal heart sounds and intact distal pulses.  Pulmonary/Chest: Effort normal.  Abdominal: Soft.  Skin: Skin is warm.  Psychiatric: She has a normal mood and affect. Her behavior is normal.  Nursing note and vitals reviewed.    Assessment and Plan:   Hannah Roman was seen today for follow-up.  Diagnoses and all orders for this visit:  Anxiety Comments: Doing well. Psychology major with weekly therapy as well. Working hard on coping mechanisms. Okay refill of Xanax when she calls.   Dysthymia Comments: She is interested in trialing another medication prior to starting school in the fall. See below.  Orders: -     FLUoxetine (PROZAC) 20 MG capsule; Take 1 capsule (20 mg total) by mouth  at bedtime.    . Reviewed expectations re: course of current medical issues. . Discussed self-management of symptoms. . Outlined signs and symptoms indicating need for more acute intervention. . Patient verbalized understanding and all questions were answered. Marland Kitchen Health Maintenance issues including appropriate healthy diet, exercise, and smoking avoidance were discussed with patient. . See orders for this visit as documented in the electronic medical record. . Patient received an After Visit Summary.  Helane Rima, DO [place] 09/08/2017  Future Appointments  Date Time Provider Department Center  10/08/2017  8:00 AM Hannah Motto, PA [facility]    CMA served as scribe during this visit. History,  Physical, and Plan performed by medical provider. The above documentation has been reviewed and is accurate and complete. Helane Rima, D.O.

## 2017-09-08 ENCOUNTER — Encounter: Payer: Self-pay | Admitting: Family Medicine

## 2017-09-11 ENCOUNTER — Encounter: Payer: Self-pay | Admitting: Surgical

## 2017-10-08 ENCOUNTER — Encounter: Payer: No Typology Code available for payment source | Admitting: Physician Assistant

## 2017-10-08 ENCOUNTER — Ambulatory Visit (INDEPENDENT_AMBULATORY_CARE_PROVIDER_SITE_OTHER): Payer: No Typology Code available for payment source | Admitting: Physician Assistant

## 2017-10-08 ENCOUNTER — Encounter: Payer: Self-pay | Admitting: Physician Assistant

## 2017-10-08 VITALS — BP 106/62 | HR 84 | Temp 98.4°F | Ht 64.0 in | Wt 132.4 lb

## 2017-10-08 DIAGNOSIS — F419 Anxiety disorder, unspecified: Secondary | ICD-10-CM

## 2017-10-08 DIAGNOSIS — Z0000 Encounter for general adult medical examination without abnormal findings: Secondary | ICD-10-CM

## 2017-10-08 DIAGNOSIS — F329 Major depressive disorder, single episode, unspecified: Secondary | ICD-10-CM

## 2017-10-08 LAB — COMPREHENSIVE METABOLIC PANEL
ALT: 18 U/L (ref 0–35)
AST: 17 U/L (ref 0–37)
Albumin: 4.3 g/dL (ref 3.5–5.2)
Alkaline Phosphatase: 58 U/L (ref 39–117)
BILIRUBIN TOTAL: 0.5 mg/dL (ref 0.2–1.2)
BUN: 15 mg/dL (ref 6–23)
CALCIUM: 9.9 mg/dL (ref 8.4–10.5)
CHLORIDE: 107 meq/L (ref 96–112)
CO2: 27 meq/L (ref 19–32)
CREATININE: 0.95 mg/dL (ref 0.40–1.20)
GFR: 78 mL/min (ref >60.00)
Glucose, Bld: 76 mg/dL (ref 70–99)
Potassium: 4.3 mEq/L (ref 3.5–5.1)
SODIUM: 140 meq/L (ref 135–145)
Total Protein: 6.9 g/dL (ref 6.0–8.3)

## 2017-10-08 LAB — CBC
HEMATOCRIT: 43.6 % (ref 36.0–46.0)
Hemoglobin: 14.5 g/dL (ref 12.0–15.0)
MCHC: 33.4 g/dL (ref 30.0–36.0)
MCV: 95.5 fl (ref 78.0–100.0)
Platelets: 194 10*3/uL (ref 150.0–400.0)
RBC: 4.56 Mil/uL (ref 3.87–5.11)
RDW: 13.5 % (ref 11.5–15.5)
WBC: 5 10*3/uL (ref 4.0–10.5)

## 2017-10-08 MED ORDER — ALPRAZOLAM 0.5 MG PO TABS: 0.5000 mg | ORAL_TABLET | Freq: Every day | ORAL | 0 refills | Status: DC | PRN

## 2017-10-08 NOTE — Patient Instructions (Addendum)
It was great to see you!  If needed, may increase Prozac to 40 mg daily. If you do this consistently for 4 weeks and find it is benefitting you, please let us know and we can send in an updated prescription.  Let's follow-up in 6 months, sooner if you have concerns.  Take care,  [place] Maintenance, Female Adopting a healthy lifestyle and getting preventive care can go a long way to promote health and wellness. Talk with your health care provider about what schedule of regular examinations is right for you. This is a good chance for you to check in with your provider about disease prevention and staying healthy. In between checkups, there are plenty of things you can do on your own. Experts have done a lot of research about which lifestyle changes and preventive measures are most likely to keep you healthy. Ask your health care provider for more information. Weight and diet Eat a healthy diet  Be sure to include plenty of vegetables, fruits, low-fat dairy products, and lean protein.  Do not eat a lot of foods high in solid fats, added sugars, or salt.  Get regular exercise. This is one of the most important things you can do for your health. ? Most adults should exercise for at least 150 minutes each week. The exercise should increase your heart rate and make you sweat (moderate-intensity exercise). ? Most adults should also do strengthening exercises at least twice a week. This is in addition to the moderate-intensity exercise.  Maintain a healthy weight  Body mass index (BMI) is a measurement that can be used to identify possible weight problems. It estimates body fat based on height and weight. Your health care provider can help determine your BMI and help you achieve or maintain a healthy weight.  For females 42 years of age and older: ? A BMI below 18.5 is considered underweight. ? A BMI of 18.5 to 24.9 is normal. ? A BMI of 25 to 29.9 is considered  overweight. ? A BMI of 30 and above is considered obese.  Watch levels of cholesterol and blood lipids  You should start having your blood tested for lipids and cholesterol at 22 years of age, then have this test every 5 years.  You may need to have your cholesterol levels checked more often if: ? Your lipid or cholesterol levels are high. ? You are older than 22 years of age. ? You are at high risk for heart disease.  Cancer screening Lung Cancer  Lung cancer screening is recommended for adults 50-76 years old who are at high risk for lung cancer because of a history of smoking.  A yearly low-dose CT scan of the lungs is recommended for people who: ? Currently smoke. ? Have quit within the past 15 years. ? Have at least a 30-pack-year history of smoking. A pack year is smoking an average of one pack of cigarettes a day for 1 year.  Yearly screening should continue until it has been 15 years since you quit.  Yearly screening should stop if you develop a health problem that would prevent you from having lung cancer treatment.  Breast Cancer  Practice breast self-awareness. This means understanding how your breasts normally appear and feel.  It also means doing regular breast self-exams. Let your health care provider know about any changes, no matter how small.  If you are in your 20s or 30s, you should have a clinical breast exam (CBE)  by a health care provider every 1-3 years as part of a regular health exam.  If you are 34 or older, have a CBE every year. Also consider having a breast X-ray (mammogram) every year.  If you have a family history of breast cancer, talk to your health care provider about genetic screening.  If you are at high risk for breast cancer, talk to your health care provider about having an MRI and a mammogram every year.  Breast cancer gene (BRCA) assessment is recommended for women who have family members with BRCA-related cancers. BRCA-related cancers  include: ? Breast. ? Ovarian. ? Tubal. ? Peritoneal cancers.  Results of the assessment will determine the need for genetic counseling and BRCA1 and BRCA2 testing.  Cervical Cancer Your health care provider may recommend that you be screened regularly for cancer of the pelvic organs (ovaries, uterus, and vagina). This screening involves a pelvic examination, including checking for microscopic changes to the surface of your cervix (Pap test). You may be encouraged to have this screening done every 3 years, beginning at age 61.  For women ages 75-65, health care providers may recommend pelvic exams and Pap testing every 3 years, or they may recommend the Pap and pelvic exam, combined with testing for human papilloma virus (HPV), every 5 years. Some types of HPV increase your risk of cervical cancer. Testing for HPV may also be done on women of any age with unclear Pap test results.  Other health care providers may not recommend any screening for nonpregnant women who are considered low risk for pelvic cancer and who do not have symptoms. Ask your health care provider if a screening pelvic exam is right for you.  If you have had past treatment for cervical cancer or a condition that could lead to cancer, you need Pap tests and screening for cancer for at least 20 years after your treatment. If Pap tests have been discontinued, your risk factors (such as having a new sexual partner) need to be reassessed to determine if screening should resume. Some women have medical problems that increase the chance of getting cervical cancer. In these cases, your health care provider may recommend more frequent screening and Pap tests.  Colorectal Cancer  This type of cancer can be detected and often prevented.  Routine colorectal cancer screening usually begins at 22 years of age and continues through 22 years of age.  Your health care provider may recommend screening at an earlier age if you have risk factors  for colon cancer.  Your health care provider may also recommend using home test kits to check for hidden blood in the stool.  A small camera at the end of a tube can be used to examine your colon directly (sigmoidoscopy or colonoscopy). This is done to check for the earliest forms of colorectal cancer.  Routine screening usually begins at age 50.  Direct examination of the colon should be repeated every 5-10 years through 22 years of age. However, you may need to be screened more often if early forms of precancerous polyps or small growths are found.  Skin Cancer  Check your skin from head to toe regularly.  Tell your health care provider about any new moles or changes in moles, especially if there is a change in a mole's shape or color.  Also tell your health care provider if you have a mole that is larger than the size of a pencil eraser.  Always use sunscreen. Apply sunscreen liberally and  repeatedly throughout the day.  Protect yourself by wearing long sleeves, pants, a wide-brimmed hat, and sunglasses whenever you are outside.  Heart disease, diabetes, and high blood pressure  High blood pressure causes heart disease and increases the risk of stroke. High blood pressure is more likely to develop in: ? People who have blood pressure in the high end of the normal range (130-139/85-89 mm Hg). ? People who are overweight or obese. ? People who are African American.  If you are 87-47 years of age, have your blood pressure checked every 3-5 years. If you are 46 years of age or older, have your blood pressure checked every year. You should have your blood pressure measured twice-once when you are at a hospital or clinic, and once when you are not at a hospital or clinic. Record the average of the two measurements. To check your blood pressure when you are not at a hospital or clinic, you can use: ? An automated blood pressure machine at a pharmacy. ? A home blood pressure monitor.  If  you are between 25 years and 51 years old, ask your health care provider if you should take aspirin to prevent strokes.  Have regular diabetes screenings. This involves taking a blood sample to check your fasting blood sugar level. ? If you are at a normal weight and have a low risk for diabetes, have this test once every three years after 22 years of age. ? If you are overweight and have a high risk for diabetes, consider being tested at a younger age or more often. Preventing infection Hepatitis B  If you have a higher risk for hepatitis B, you should be screened for this virus. You are considered at high risk for hepatitis B if: ? You were born in a country where hepatitis B is common. Ask your health care provider which countries are considered high risk. ? Your parents were born in a high-risk country, and you have not been immunized against hepatitis B (hepatitis B vaccine). ? You have HIV or AIDS. ? You use needles to inject street drugs. ? You live with someone who has hepatitis B. ? You have had sex with someone who has hepatitis B. ? You get hemodialysis treatment. ? You take certain medicines for conditions, including cancer, organ transplantation, and autoimmune conditions.  Hepatitis C  Blood testing is recommended for: ? Everyone born from [birth year] through 1965. ? Anyone with known risk factors for hepatitis C.  Sexually transmitted infections (STIs)  You should be screened for sexually transmitted infections (STIs) including gonorrhea and chlamydia if: ? You are sexually active and are younger than 22 years of age. ? You are older than 22 years of age and your health care provider tells you that you are at risk for this type of infection. ? Your sexual activity has changed since you were last screened and you are at an increased risk for chlamydia or gonorrhea. Ask your health care provider if you are at risk.  If you do not have HIV, but are at risk, it may be recommended  that you take a prescription medicine daily to prevent HIV infection. This is called pre-exposure prophylaxis (PrEP). You are considered at risk if: ? You are sexually active and do not regularly use condoms or know the HIV status of your partner(s). ? You take drugs by injection. ? You are sexually active with a partner who has HIV.  Talk with your health care provider about whether you  are at high risk of being infected with HIV. If you choose to begin PrEP, you should first be tested for HIV. You should then be tested every 3 months for as long as you are taking PrEP. Pregnancy  If you are premenopausal and you may become pregnant, ask your health care provider about preconception counseling.  If you may become pregnant, take 400 to 800 micrograms (mcg) of folic acid every day.  If you want to prevent pregnancy, talk to your health care provider about birth control (contraception). Osteoporosis and menopause  Osteoporosis is a disease in which the bones lose minerals and strength with aging. This can result in serious bone fractures. Your risk for osteoporosis can be identified using a bone density scan.  If you are 70 years of age or older, or if you are at risk for osteoporosis and fractures, ask your health care provider if you should be screened.  Ask your health care provider whether you should take a calcium or vitamin D supplement to lower your risk for osteoporosis.  Menopause may have certain physical symptoms and risks.  Hormone replacement therapy may reduce some of these symptoms and risks. Talk to your health care provider about whether hormone replacement therapy is right for you. Follow these instructions at home:  Schedule regular health, dental, and eye exams.  Stay current with your immunizations.  Do not use any tobacco products including cigarettes, chewing tobacco, or electronic cigarettes.  If you are pregnant, do not drink alcohol.  If you are  breastfeeding, limit how much and how often you drink alcohol.  Limit alcohol intake to no more than 1 drink per day for nonpregnant women. One drink equals 12 ounces of beer, 5 ounces of wine, or 1 ounces of hard liquor.  Do not use street drugs.  Do not share needles.  Ask your health care provider for help if you need support or information about quitting drugs.  Tell your health care provider if you often feel depressed.  Tell your health care provider if you have ever been abused or do not feel safe at home. This information is not intended to replace advice given to you by your health care provider. Make sure you discuss any questions you have with your health care provider. Document Released: 08/22/2010 Document Revised: 07/15/2015 Document Reviewed: 11/10/2014 Elsevier Interactive Patient Education  [employer].

## 2017-10-08 NOTE — Progress Notes (Signed)
Subjective:    Hannah Roman is a 22 y.o. female and is here for a comprehensive physical exam.  HPI  There are no preventive care reminders to display for this patient.  Acute Concerns: None  Chronic Issues: Anxiety and Depression -- was last in our office 1 month ago for follow-up on anxiety and depression. She was started on Prozac 20 mg. She has been doing well on this. Denies side effects that she usually has with other medications, such as abdominal pain or "feeling very low." Denies any SI/HI. Counseling every other week.  Health Maintenance: Immunizations -- UTD Diet -- stable, no significant changes Caffeine intake -- 1 cup daily Sleep habits -- "so so", having trouble staying asleep Exercise -- minimal Current Weight -- Weight: 132 lb 6.4 oz (60.1 kg)  Weight History: Wt Readings from Last 10 Encounters:  10/08/17 132 lb 6.4 oz (60.1 kg)  09/07/17 132 lb (59.9 kg)  02/28/17 133 lb 12.8 oz (60.7 kg)  01/05/17 135 lb (61.2 kg)  12/07/16 139 lb (63 kg)  10/26/16 135 lb (61.2 kg)  Mood -- okay Patient's last menstrual period was 10/03/2017. Period characteristics -- lasts a few days Birth control -- not on any  Depression screen PHQ 2/9 10/08/2017  Decreased Interest 1  Down, Depressed, Hopeless 1  PHQ - 2 Score 2  Altered sleeping 1  Tired, decreased energy 1  Change in appetite 0  Feeling bad or failure about yourself  0  Trouble concentrating 2  Moving slowly or fidgety/restless 0  Suicidal thoughts 0  PHQ-9 Score 6  Difficult doing work/chores Somewhat difficult    Other providers/specialists: Eye doctor and dentist in [place]  PMHx, SurgHx, SocialHx, Medications, and Allergies were reviewed in the Visit Navigator and updated as appropriate.   Past Medical History:  Diagnosis Date  . Anxiety   . Depression   . History of chicken pox      Past Surgical History:  Procedure Laterality Date  . 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 Depression Father   . High Cholesterol Father   . Hypertension Father   . Cancer Maternal Grandmother   . Depression Maternal Grandmother   . Breast cancer Paternal Grandmother   . Lung cancer Paternal Grandfather   . Brain cancer Paternal Aunt [id number]  . Colon cancer Neg Hx     Social History   Tobacco Use  . Smoking status: Never Smoker  . Smokeless tobacco: Never Used  Substance Use Topics  . Alcohol use: Yes    Comment: socially  . Drug use: No    Review of Systems:   Review of Systems  Constitutional: Negative for chills, fever, malaise/fatigue and weight loss.  HENT: Negative for hearing loss, sinus pain and sore throat.   Respiratory: Negative for cough and hemoptysis.   Cardiovascular: Negative for chest pain, palpitations, leg swelling and PND.  Gastrointestinal: Negative for abdominal pain, constipation, diarrhea, heartburn, nausea and vomiting.  Genitourinary: Negative for dysuria, frequency and urgency.  Musculoskeletal: Negative for back pain, myalgias and neck pain.  Skin: Negative for itching and rash.  Neurological: Negative for dizziness, tingling, seizures and headaches.  Endo/Heme/Allergies: Negative for polydipsia.  Psychiatric/Behavioral: Negative for depression. The patient is nervous/anxious.     Objective:   BP 106/62 (BP Location: Left Arm, Patient Position: Sitting, Cuff Size: Normal)   Pulse 84  Temp 98.4 F (36.9 C) (Oral)   Ht 5\' 4"  (1.626 m)   Wt 132 lb 6.4 oz (60.1 kg)   LMP 10/03/2017   SpO2 99%   BMI 22.73 kg/m   General Appearance:    Alert, cooperative, no distress, appears stated age  Head:    Normocephalic, without obvious abnormality, atraumatic  Eyes:    PERRL, conjunctiva/corneas clear, EOM's intact, fundi    benign, both eyes  Ears:    Normal TM's and external ear canals, both ears  Nose:   Nares normal, septum midline, mucosa normal, no drainage    or  sinus tenderness  Throat:   Lips, mucosa, and tongue normal; teeth and gums normal  Neck:   Supple, symmetrical, trachea midline, no adenopathy;    thyroid:  no enlargement/tenderness/nodules; no carotid   bruit or JVD  Back:     Symmetric, no curvature, ROM normal, no CVA tenderness  Lungs:     Clear to auscultation bilaterally, respirations unlabored  Chest Wall:    No tenderness or deformity   Heart:    Regular rate and rhythm, S1 and S2 normal, no murmur, rub   or gallop  Breast Exam:    Deferred  Abdomen:     Soft, non-tender, bowel sounds active all four quadrants,    no masses, no organomegaly  Genitalia:    Deferred  Rectal:    Deferred  Extremities:   Extremities normal, atraumatic, no cyanosis or edema  Pulses:   2+ and symmetric all extremities  Skin:   Skin color, texture, turgor normal, no rashes or lesions  Lymph nodes:   Cervical, supraclavicular, and axillary nodes normal  Neurologic:   CNII-XII intact, normal strength, sensation and reflexes    throughout    Assessment/Plan:   Problem List Items Addressed This Visit      Other   Anxiety and depression    Currently overall controlled. She is considering increasing to Prozac 40 mg if needed after school starts (starts tomorrow.) [place] Controlled Substance Database reviewed today regarding patient.  Patient is compliant with [facility] regarding pharmacy use and one-prescribing provider. It is appropriate to continue current medication regimen.        Relevant Medications   ALPRAZolam (XANAX) 0.5 MG tablet   Other Relevant Orders   Comprehensive metabolic panel   CBC    Other Visit Diagnoses    Routine physical examination    -  Primary Today patient counseled on age appropriate routine health concerns for screening and prevention, each reviewed and up to date or declined. Immunizations reviewed and up to date or declined. Labs ordered and reviewed. Risk factors for depression reviewed and negative. Hearing function and  visual acuity are intact. ADLs screened and addressed as needed. Functional ability and level of safety reviewed and appropriate. Education, counseling and referrals performed based on assessed risks today. Patient provided with a copy of personalized plan for preventive services.       Well Adult Exam: Labs ordered: Yes. Patient counseling was done. See below for items discussed. Discussed the patient's BMI. The BMI BMI is in the acceptable range Follow up in 6 months.  Patient Counseling:   [x]     Nutrition: Stressed importance of moderation in sodium/caffeine intake, saturated fat and cholesterol, caloric balance, sufficient intake of fresh fruits, vegetables, fiber, calcium, iron, and 1 mg of folate supplement per day (for females capable of pregnancy).   [x]      Stressed the importance of regular exercise.    [  x]    Substance Abuse: Discussed cessation/primary prevention of tobacco, alcohol, or other drug use; driving or other dangerous activities under the influence; availability of treatment for abuse.    [x]      Injury prevention: Discussed safety belts, safety helmets, smoke detector, smoking near bedding or upholstery.    [x]      Sexuality: Discussed sexually transmitted diseases, partner selection, use of condoms, avoidance of unintended pregnancy  and contraceptive alternatives.    [x]     Dental health: Discussed importance of regular tooth brushing, flossing, and dental visits.   [x]      Health maintenance and immunizations reviewed. Please refer to Health maintenance section.    Jarold Motto, PA-C [facility]

## 2017-10-08 NOTE — Assessment & Plan Note (Signed)
Currently overall controlled. She is considering increasing to Prozac 40 mg if needed after school starts (starts tomorrow.) [place] Controlled Substance Database reviewed today regarding patient.  Patient is compliant with [facility] regarding pharmacy use and one-prescribing provider. It is appropriate to continue current medication regimen.
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
## 2018-04-10 ENCOUNTER — Ambulatory Visit: Payer: No Typology Code available for payment source | Admitting: Physician Assistant

## 2018-05-28 ENCOUNTER — Telehealth: Payer: Self-pay | Admitting: *Deleted

## 2018-05-28 NOTE — Telephone Encounter (Signed)
Needs f/u for Anxiety

## 2018-07-02 ENCOUNTER — Encounter: Payer: Self-pay | Admitting: Family Medicine

## 2018-07-02 ENCOUNTER — Telehealth: Payer: No Typology Code available for payment source | Admitting: Physician Assistant

## 2018-07-02 DIAGNOSIS — J039 Acute tonsillitis, unspecified: Secondary | ICD-10-CM | POA: Diagnosis not present

## 2018-07-02 MED ORDER — AZITHROMYCIN 250 MG PO TABS: ORAL_TABLET | ORAL | 0 refills | Status: DC

## 2018-07-02 NOTE — Progress Notes (Signed)
We are sorry that you are not feeling well.  Here is how we plan to help!  Based on what you have shared with me it is likely that you have strep pharyngitis.  Strep pharyngitis is inflammation and infection in the back of the throat.  This is an infection cause by bacteria and is treated with antibiotics.  I have prescribed Azithromycin 250 mg two tablets today and then one daily for 4 additional days. For throat pain, we recommend over the counter oral pain relief medications such as acetaminophen or aspirin, or anti-inflammatory medications such as ibuprofen or naproxen sodium. Topical treatments such as oral throat lozenges or sprays may be used as needed. Strep infections are not as easily transmitted as other respiratory infections, however we still recommend that you avoid close contact with loved ones, especially the very young and elderly. Remember to wash your hands thoroughly throughout the day as this is the number one way to prevent the spread of infection and wipe down door knobs and counters with disinfectant.   Home Care: Only take medications as instructed by your medical team. Complete the entire course of an antibiotic. Do not take these medications with alcohol. A steam or ultrasonic humidifier can help congestion.  You can place a towel over your head and breathe in the steam from hot water coming from a faucet. Avoid close contacts especially the very young and the elderly. Cover your mouth when you cough or sneeze. Always remember to wash your hands.  Get Help Right Away If: You develop worsening fever or sinus pain. You develop a severe head ache or visual changes. Your symptoms persist after you have completed your treatment plan.  Make sure you Understand these instructions. Will watch your condition. Will get help right away if you are not doing well or get worse.  Your e-visit answers were reviewed by a board certified advanced clinical practitioner to complete  your personal care plan.  Depending on the condition, your plan could have included both over the counter or prescription medications.  If there is a problem please reply once you have received a response from your provider.  Your safety is important to us.  If you have drug allergies check your prescription carefully.    You can use MyChart to ask questions about today's visit, request a non-urgent call back, or ask for a work or school excuse for 24 hours related to this e-Visit. If it has been greater than 24 hours you will need to follow up with your provider, or enter a new e-Visit to address those concerns.  You will get an e-mail in the next two days asking about your experience.  I hope that your e-visit has been valuable and will speed your recovery. Thank you for using e-visits.    ===View-only below this line===   ----- Message -----    From: Murrell ConverseAllison Lall    Sent: 07/02/2018 10:36 AM EDT      To: E-Visit Mailing List Subject: E-Visit Submission: Sore Throat  E-Visit Submission: Sore Throat --------------------------------  Question: Do you have any of the following symptoms (please select all that apply)? Answer:   Pain in the throat when swallowing            Enlarged lymph glands in the neck            White patches or pus on side or back of the throat  Question: How long have you been having these symptoms? Answer:  6 days  Question: Do you have a fever? Answer:   No, I do not have a fever  Question: Are you in close contact with anyone who has similar symptoms ? Answer:   No  Question: Are you taking any over the counter medications for your symptoms? Answer:   Yes  Question: Please list the name(s) of the over the counter medications and whether they are helping or not: Answer:   Tylenol                         Not helping  Question: Please list your medication allergies that you may have ? (If 'none' , please list as 'none') Answer:    Penicillin  Question: Please list any additional comments  Answer:     Question: Are you pregnant? Answer:   I am confident that I am not pregnant  Question: Are you breastfeeding? Answer:   No  A total of 5-10 minutes was spent evaluating this patients questionnaire and formulating a plan of care.

## 2020-08-17 LAB — OB RESULTS CONSOLE ANTIBODY SCREEN: Antibody Screen: NEGATIVE

## 2020-08-17 LAB — OB RESULTS CONSOLE ABO/RH: RH Type: POSITIVE

## 2020-08-17 LAB — OB RESULTS CONSOLE HIV ANTIBODY (ROUTINE TESTING): HIV: NONREACTIVE

## 2020-08-17 LAB — OB RESULTS CONSOLE HEPATITIS B SURFACE ANTIGEN: Hepatitis B Surface Ag: NEGATIVE

## 2020-08-17 LAB — HEPATITIS C ANTIBODY: HCV Ab: NEGATIVE

## 2020-08-17 LAB — OB RESULTS CONSOLE RUBELLA ANTIBODY, IGM: Rubella: IMMUNE

## 2020-08-17 LAB — OB RESULTS CONSOLE RPR: RPR: NONREACTIVE

## 2020-09-02 LAB — OB RESULTS CONSOLE GC/CHLAMYDIA
Chlamydia: NEGATIVE
Gonorrhea: NEGATIVE

## 2021-01-21 ENCOUNTER — Inpatient Hospital Stay (HOSPITAL_COMMUNITY)
Admission: AD | Admit: 2021-01-21 | Discharge: 2021-01-22 | Disposition: A | Discharge status: Home / Self Care | Payer: BC Managed Care – PPO | Attending: Obstetrics & Gynecology | Admitting: Obstetrics & Gynecology

## 2021-01-21 ENCOUNTER — Encounter (HOSPITAL_COMMUNITY): Payer: Self-pay | Admitting: Obstetrics & Gynecology

## 2021-01-21 ENCOUNTER — Other Ambulatory Visit: Payer: Self-pay

## 2021-01-21 DIAGNOSIS — O2303 Infections of kidney in pregnancy, third trimester: Secondary | ICD-10-CM

## 2021-01-21 DIAGNOSIS — B957 Other staphylococcus as the cause of diseases classified elsewhere: Secondary | ICD-10-CM | POA: Diagnosis not present

## 2021-01-21 DIAGNOSIS — Z880 Allergy status to penicillin: Secondary | ICD-10-CM | POA: Insufficient documentation

## 2021-01-21 DIAGNOSIS — R1032 Left lower quadrant pain: Secondary | ICD-10-CM | POA: Insufficient documentation

## 2021-01-21 DIAGNOSIS — N760 Acute vaginitis: Secondary | ICD-10-CM | POA: Diagnosis not present

## 2021-01-21 DIAGNOSIS — Z3A30 30 weeks gestation of pregnancy: Secondary | ICD-10-CM | POA: Diagnosis not present

## 2021-01-21 DIAGNOSIS — O26893 Other specified pregnancy related conditions, third trimester: Secondary | ICD-10-CM | POA: Diagnosis not present

## 2021-01-21 DIAGNOSIS — Z3689 Encounter for other specified antenatal screening: Secondary | ICD-10-CM | POA: Diagnosis not present

## 2021-01-21 DIAGNOSIS — R110 Nausea: Secondary | ICD-10-CM | POA: Diagnosis not present

## 2021-01-21 LAB — URINALYSIS, ROUTINE W REFLEX MICROSCOPIC
Bacteria, UA: NONE SEEN
Bilirubin Urine: NEGATIVE
Glucose, UA: 50 mg/dL — AB
Hgb urine dipstick: NEGATIVE
Ketones, ur: 5 mg/dL — AB
Nitrite: POSITIVE — AB
Protein, ur: NEGATIVE mg/dL
Specific Gravity, Urine: 1.028 (ref 1.005–1.030)
pH: 5 (ref 5.0–8.0)

## 2021-01-21 LAB — WET PREP, GENITAL
Sperm: NONE SEEN
Trich, Wet Prep: NONE SEEN
WBC, Wet Prep HPF POC: <10 (ref <10)
Yeast Wet Prep HPF POC: NONE SEEN

## 2021-01-21 MED ORDER — NITROFURANTOIN MONOHYD MACRO 100 MG PO CAPS
100.0000 mg | ORAL_CAPSULE | Freq: Once | ORAL | Status: AC
  Administered 2021-01-21: 100 mg via ORAL
  Filled 2021-01-21: qty 1

## 2021-01-21 MED ORDER — NITROFURANTOIN MONOHYD MACRO 100 MG PO CAPS: 100.0000 mg | ORAL_CAPSULE | Freq: Two times a day (BID) | ORAL | 0 refills | Status: DC

## 2021-01-21 MED ORDER — METRONIDAZOLE 0.75 % VA GEL: 1.0000 | Freq: Every day | VAGINAL | 0 refills | Status: DC

## 2021-01-21 MED ORDER — CEFTRIAXONE SODIUM 1 G IJ SOLR
1.0000 g | Freq: Once | INTRAMUSCULAR | Status: AC
  Administered 2021-01-21: 1 g via INTRAMUSCULAR
  Filled 2021-01-21: qty 10

## 2021-01-21 MED ORDER — ACETAMINOPHEN 500 MG PO TABS
1000.0000 mg | ORAL_TABLET | Freq: Once | ORAL | Status: AC
  Administered 2021-01-21: 1000 mg via ORAL
  Filled 2021-01-21: qty 2

## 2021-01-21 NOTE — MAU Note (Signed)
..  Hannah Roman is a 25 y.o. at [redacted]w[redacted]d here in MAU reporting: Intermittent left sided lower abdominal pain that began last night, it happens at night and stops during the day. Feels like really bad period cramps.  It started back tonight around 6pm. +FM. Denies vaginal bleeding or leaking of fluid, reports more white mucous-like vaginal discharge.  Pain score: 7/10 Vitals:   01/21/21 2116  BP: 112/69  Pulse: 99  Resp: 17  Temp: 97.9 F (36.6 C)  SpO2: 100%     FHT:143

## 2021-01-21 NOTE — MAU Provider Note (Signed)
History     CSN: [id number]  Arrival date and time: 01/21/21 2031   Event Date/Time   First Provider Initiated Contact with Patient 01/21/21 2200      Chief Complaint  Patient presents with   Abdominal Pain   Hannah Roman is a 25 y.o. G1P0 at [redacted]w[redacted]d who receives care at [facility].  She presents today for Abdominal Pain.  She states the pain has been occurring for the past two evenings.  She states it is located in "my lower left side" and is "constant once it starts at night."  She describes the pain as "intense period cramps."  She states certain positions results in minimal relief, while walking worsens the pain.  She states she has not attempted to use any OTC medications or compresses to the area.  She endorses fetal movement and denies vaginal bleeding or leaking.  She reports increased discharge, but no abnormal odor or color. She reports having some nausea earlier, but none currently.    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, had reflux    Family History  Problem Relation Age of Onset   Depression Father    High Cholesterol Father    Hypertension Father    Cancer Maternal Grandmother    Depression Maternal Grandmother    Breast cancer Paternal Grandmother    Lung cancer Paternal Grandfather    Brain cancer Paternal Aunt 55   Colon cancer Neg Hx     Social History   Tobacco Use   Smoking status: Never   Smokeless tobacco: Never  Vaping Use   Vaping Use: Never used  Substance Use Topics   Alcohol use: Not Currently    Comment: socially   Drug use: No    Allergies:  Allergies  Allergen Reactions    Penicillins Hives    Childhood reaction    Lexapro [Escitalopram Oxalate]     GI symptoms     Medications Prior to Admission  Medication Sig Dispense Refill Last Dose   Prenatal Vit-Fe Fumarate-FA (PRENATAL MULTIVITAMIN) TABS tablet Take 1 tablet by mouth daily at 12 noon.   01/21/2021   ALPRAZolam (XANAX) 0.5 MG tablet Take 1 tablet (0.5 mg total) by mouth daily as needed for anxiety. 20 tablet 0    azithromycin (ZITHROMAX) 250 MG tablet For strep throat take two tabs today and one daily thereafter. 6 tablet 0    FLUoxetine (PROZAC) 20 MG capsule Take 1 capsule (20 mg total) by mouth at bedtime. 90 capsule 3     Review of Systems  Constitutional:  Negative for chills and fever.  Respiratory:  Negative for cough and shortness of breath.   Cardiovascular:  Negative for chest pain.  Gastrointestinal:  Positive for abdominal pain and nausea. Negative for vomiting.  Genitourinary:  Negative for  difficulty urinating, dysuria, vaginal bleeding and vaginal discharge.  Musculoskeletal:  Negative for back pain.  Neurological:  Negative for dizziness, light-headedness and headaches.  Physical Exam   Blood pressure 109/66, pulse 87, temperature 97.9 F (36.6 C), temperature source Oral, resp. rate 18, height 5\' 4"  (1.626 m), weight 62 kg, SpO2 100 %.  Physical Exam Vitals reviewed. Exam conducted with a chaperone present.  Constitutional:      Appearance: She is well-developed.  HENT:     Head: Normocephalic and atraumatic.  Pulmonary:     Effort: Pulmonary effort is normal. No respiratory distress.     Breath sounds: Normal breath sounds.  Abdominal:     General: Bowel sounds are normal.     Palpations: Abdomen is soft.     Tenderness: There is abdominal tenderness in the left upper quadrant and left lower quadrant. There is left CVA tenderness.     Comments: Gravid, Appears AGA  Genitourinary:    Labia:        Right: No tenderness.        Left: No tenderness.      Vagina: Vaginal  discharge present. No bleeding.     Cervix: No cervical motion tenderness, discharge, friability, lesion, erythema or cervical bleeding.     Uterus: Enlarged.      Comments: Speculum Exam: -Normal External Genitalia: Non tender, no apparent discharge at introitus.  -Vaginal Vault: Pink mucosa with good rugae. Small amt thin grayish discharge -wet prep and GC/CT collected -Cervix:Pink, no lesions, cysts, or polyps.  Nulliparous and closed. No active bleeding from os -Bimanual Exam:  Tenderness in left cul de sac.  No CMT.    Skin:    General: Skin is warm and dry.  Neurological:     Mental Status: She is alert.    Fetal Assessment 140 bpm, Mod Var, -Decels, +15 x15 Accels Toco: No Ctx Graphed  MAU Course   Results for orders placed or performed during the hospital encounter of 01/21/21 (from the past 24 hour(s))  Urinalysis, Routine w reflex microscopic Urine, Clean Catch     Status: Abnormal   Collection Time: 01/21/21  9:23 PM  Result Value Ref Range   Color, Urine YELLOW YELLOW   APPearance HAZY (A) CLEAR   Specific Gravity, Urine 1.028 1.005 - 1.030   pH 5.0 5.0 - 8.0   Glucose, UA 50 (A) NEGATIVE mg/dL   Hgb urine dipstick NEGATIVE NEGATIVE   Bilirubin Urine NEGATIVE NEGATIVE   Ketones, ur 5 (A) NEGATIVE mg/dL   Protein, ur NEGATIVE NEGATIVE mg/dL   Nitrite POSITIVE (A) NEGATIVE   Leukocytes,Ua SMALL (A) NEGATIVE   RBC / HPF 0-5 0 - 5 RBC/hpf   WBC, UA 21-50 0 - 5 WBC/hpf   Bacteria, UA NONE SEEN NONE SEEN   Squamous Epithelial / LPF 0-5 0 - 5   Mucus PRESENT   Wet prep, genital     Status: Abnormal   Collection Time: 01/21/21 10:15 PM   Specimen: PATH Cytology Cervicovaginal Ancillary Only  Result Value Ref Range   Yeast Wet Prep HPF POC NONE SEEN NONE SEEN   Trich, Wet Prep NONE SEEN NONE SEEN   Clue Cells Wet Prep HPF POC PRESENT (A) NONE SEEN   WBC, Wet Prep HPF POC <10 <10   Sperm NONE SEEN    No results found.  MDM PE Cultures: Wet Prep, GC/CT Labs:  UA, UC EFM Pain medication Antibiotics Assessment and Plan  25 year old G1P0  SIUP  at 30.5 weeks Cat I FT Acute Cystitis vs Pyelo +CVAT  -POC Reviewed -Exam performed and findings discussed. -Patient offered and agreeable to pain medication. -Will give tylenol and reassess.  -UA returns positive for leuks and nitrites -Discussed results and initiation of treatment tonight. -Patient with PCN allergy. -Dr. Otelia Limes consulted and informed of patient status, evaluation, interventions, and results. Advised: *Okay to give Macrobid *Educates that chances of reaction with rocephin <10% and proceed with IM Dose of 1 gram Rocephin.  -Provider to bedside to discuss results and POC -Informed that she would be observed and if reaction to occur will manage accordingly. -No questions or concerns and patient agreeable.  -NST Reactive; Okay to discontinue monitoring.   Cherre Robins MSN, CNM 01/21/2021, 10:00 PM   Reassessment (11:51 PM)  -Patient s/p medications. -Reports improvement in pain. -Discussed treatment with macrobid and how medications may need to be changed considering UC results. -Patient verbalizes understanding and without questions.  -Precautions given. -Will also send in script for bacterial vaginosis. Nurse to provide education and inform of treatment. -Patient to follow up with primary ob as apparopriate. -Encouraged to call primary office or return to MAU if symptoms worsen or with the onset of new symptoms. -Discharged to home in stable condition.  Cherre Robins MSN, CNM Advanced Practice Provider, [facility]

## 2021-01-24 LAB — CULTURE, OB URINE: Culture: >=100000 — AB

## 2021-01-24 LAB — GC/CHLAMYDIA PROBE AMP (~~LOC~~) NOT AT ARMC
Chlamydia: NEGATIVE
Comment: NEGATIVE
Comment: NORMAL
Neisseria Gonorrhea: NEGATIVE

## 2021-02-20 NOTE — L&D Delivery Note (Signed)
Delivery Note At 2:31 PM a viable female was delivered via Vaginal, Spontaneous (Presentation: Right Occiput Anterior).  APGAR: 9, 9; weight pending.  Placenta status: Spontaneous, Intact.  Cord: 3 vessels with the following complications: None.  Cord pH: n/a  Anesthesia: Epidural Episiotomy: None Lacerations: 2nd degree;Periurethral Suture Repair: 3.0 vicryl rapide Est. Blood Loss (mL):  250  Mom to postpartum.  Baby to Couplet care / Skin to Skin.  Mitchel Honour 03/29/2021, 3:00 PM

## 2021-03-03 LAB — OB RESULTS CONSOLE GBS: GBS: NEGATIVE

## 2021-03-28 ENCOUNTER — Other Ambulatory Visit: Payer: Self-pay

## 2021-03-29 ENCOUNTER — Inpatient Hospital Stay (HOSPITAL_COMMUNITY)
Admission: AD | Admit: 2021-03-29 | Discharge: 2021-03-30 | DRG: 807 | Disposition: A | Discharge status: Home / Self Care | Payer: BC Managed Care – PPO | Attending: Obstetrics & Gynecology | Admitting: Obstetrics & Gynecology

## 2021-03-29 ENCOUNTER — Inpatient Hospital Stay (HOSPITAL_COMMUNITY): Payer: BC Managed Care – PPO

## 2021-03-29 ENCOUNTER — Encounter (HOSPITAL_COMMUNITY): Payer: Self-pay | Admitting: Obstetrics & Gynecology

## 2021-03-29 ENCOUNTER — Inpatient Hospital Stay (HOSPITAL_COMMUNITY): Payer: BC Managed Care – PPO | Admitting: Anesthesiology

## 2021-03-29 DIAGNOSIS — O480 Post-term pregnancy: Principal | ICD-10-CM | POA: Diagnosis present

## 2021-03-29 DIAGNOSIS — Z3A40 40 weeks gestation of pregnancy: Secondary | ICD-10-CM

## 2021-03-29 DIAGNOSIS — Z20822 Contact with and (suspected) exposure to covid-19: Secondary | ICD-10-CM | POA: Diagnosis present

## 2021-03-29 DIAGNOSIS — Z3490 Encounter for supervision of normal pregnancy, unspecified, unspecified trimester: Secondary | ICD-10-CM

## 2021-03-29 LAB — CBC
HCT: 38.2 % (ref 36.0–46.0)
Hemoglobin: 13.1 g/dL (ref 12.0–15.0)
MCH: 32.1 pg (ref 26.0–34.0)
MCHC: 34.3 g/dL (ref 30.0–36.0)
MCV: 93.6 fL (ref 80.0–100.0)
Platelets: 143 10*3/uL — ABNORMAL LOW (ref 150–400)
RBC: 4.08 MIL/uL (ref 3.87–5.11)
RDW: 13 % (ref 11.5–15.5)
WBC: 11.4 10*3/uL — ABNORMAL HIGH (ref 4.0–10.5)
nRBC: 0 % (ref 0.0–0.2)

## 2021-03-29 LAB — RESP PANEL BY RT-PCR (FLU A&B, COVID) ARPGX2
Influenza A by PCR: NEGATIVE
Influenza B by PCR: NEGATIVE
SARS Coronavirus 2 by RT PCR: NEGATIVE

## 2021-03-29 LAB — TYPE AND SCREEN
ABO/RH(D): O POS
Antibody Screen: NEGATIVE

## 2021-03-29 LAB — RPR: RPR Ser Ql: NONREACTIVE

## 2021-03-29 MED ORDER — LACTATED RINGERS IV SOLN: INTRAVENOUS | Status: DC

## 2021-03-29 MED ORDER — LIDOCAINE HCL (PF) 1 % IJ SOLN: 30.0000 mL | INTRAMUSCULAR | Status: DC | PRN

## 2021-03-29 MED ORDER — OXYTOCIN BOLUS FROM INFUSION
333.0000 mL | Freq: Once | INTRAVENOUS | Status: AC
  Administered 2021-03-29: 333 mL via INTRAVENOUS

## 2021-03-29 MED ORDER — MISOPROSTOL 25 MCG QUARTER TABLET
25.0000 ug | ORAL_TABLET | ORAL | Status: DC | PRN
  Administered 2021-03-29 (×2): 25 ug via VAGINAL
  Filled 2021-03-29 (×2): qty 1

## 2021-03-29 MED ORDER — DIPHENHYDRAMINE HCL 25 MG PO CAPS: 25.0000 mg | ORAL_CAPSULE | Freq: Four times a day (QID) | ORAL | Status: DC | PRN

## 2021-03-29 MED ORDER — DIBUCAINE (PERIANAL) 1 % EX OINT: 1.0000 "application " | TOPICAL_OINTMENT | CUTANEOUS | Status: DC | PRN

## 2021-03-29 MED ORDER — ONDANSETRON HCL 4 MG/2ML IJ SOLN
4.0000 mg | Freq: Four times a day (QID) | INTRAMUSCULAR | Status: DC | PRN
  Administered 2021-03-29: 4 mg via INTRAVENOUS
  Filled 2021-03-29: qty 2

## 2021-03-29 MED ORDER — ONDANSETRON HCL 4 MG PO TABS: 4.0000 mg | ORAL_TABLET | ORAL | Status: DC | PRN

## 2021-03-29 MED ORDER — WITCH HAZEL-GLYCERIN EX PADS: 1.0000 "application " | MEDICATED_PAD | CUTANEOUS | Status: DC | PRN

## 2021-03-29 MED ORDER — TERBUTALINE SULFATE 1 MG/ML IJ SOLN
0.2500 mg | Freq: Once | INTRAMUSCULAR | Status: AC | PRN
  Administered 2021-03-29: 0.25 mg via SUBCUTANEOUS
  Filled 2021-03-29: qty 1

## 2021-03-29 MED ORDER — OXYCODONE-ACETAMINOPHEN 5-325 MG PO TABS: 2.0000 | ORAL_TABLET | ORAL | Status: DC | PRN

## 2021-03-29 MED ORDER — SOD CITRATE-CITRIC ACID 500-334 MG/5ML PO SOLN: 30.0000 mL | ORAL | Status: DC | PRN

## 2021-03-29 MED ORDER — PHENYLEPHRINE 40 MCG/ML (10ML) SYRINGE FOR IV PUSH (FOR BLOOD PRESSURE SUPPORT)
80.0000 ug | PREFILLED_SYRINGE | INTRAVENOUS | Status: DC | PRN
  Administered 2021-03-29: 80 ug via INTRAVENOUS

## 2021-03-29 MED ORDER — TETANUS-DIPHTH-ACELL PERTUSSIS 5-2.5-18.5 LF-MCG/0.5 IM SUSY: 0.5000 mL | PREFILLED_SYRINGE | Freq: Once | INTRAMUSCULAR | Status: DC

## 2021-03-29 MED ORDER — PRENATAL MULTIVITAMIN CH
1.0000 | ORAL_TABLET | Freq: Every day | ORAL | Status: DC
  Administered 2021-03-30: 1 via ORAL
  Filled 2021-03-29: qty 1

## 2021-03-29 MED ORDER — BENZOCAINE-MENTHOL 20-0.5 % EX AERO
1.0000 "application " | INHALATION_SPRAY | CUTANEOUS | Status: DC | PRN
  Administered 2021-03-29: 1 via TOPICAL
  Filled 2021-03-29: qty 56

## 2021-03-29 MED ORDER — ZOLPIDEM TARTRATE 5 MG PO TABS: 5.0000 mg | ORAL_TABLET | Freq: Every evening | ORAL | Status: DC | PRN

## 2021-03-29 MED ORDER — LACTATED RINGERS IV SOLN: 500.0000 mL | Freq: Once | INTRAVENOUS | Status: DC

## 2021-03-29 MED ORDER — OXYCODONE-ACETAMINOPHEN 5-325 MG PO TABS: 1.0000 | ORAL_TABLET | ORAL | Status: DC | PRN

## 2021-03-29 MED ORDER — LACTATED RINGERS IV SOLN
500.0000 mL | INTRAVENOUS | Status: DC | PRN
  Administered 2021-03-29: 500 mL via INTRAVENOUS

## 2021-03-29 MED ORDER — ACETAMINOPHEN 325 MG PO TABS: 650.0000 mg | ORAL_TABLET | ORAL | Status: DC | PRN

## 2021-03-29 MED ORDER — SENNOSIDES-DOCUSATE SODIUM 8.6-50 MG PO TABS
2.0000 | ORAL_TABLET | Freq: Every day | ORAL | Status: DC
  Administered 2021-03-30: 2 via ORAL
  Filled 2021-03-29: qty 2

## 2021-03-29 MED ORDER — FENTANYL CITRATE (PF) 100 MCG/2ML IJ SOLN: 50.0000 ug | INTRAMUSCULAR | Status: DC | PRN

## 2021-03-29 MED ORDER — OXYTOCIN-SODIUM CHLORIDE 30-0.9 UT/500ML-% IV SOLN
2.5000 [IU]/h | INTRAVENOUS | Status: DC
  Administered 2021-03-29: 2.5 [IU]/h via INTRAVENOUS
  Filled 2021-03-29: qty 500

## 2021-03-29 MED ORDER — ONDANSETRON HCL 4 MG/2ML IJ SOLN: 4.0000 mg | INTRAMUSCULAR | Status: DC | PRN

## 2021-03-29 MED ORDER — TERBUTALINE SULFATE 1 MG/ML IJ SOLN
0.2500 mg | Freq: Once | INTRAMUSCULAR | Status: AC
  Administered 2021-03-29: 0.25 mg via SUBCUTANEOUS

## 2021-03-29 MED ORDER — EPHEDRINE 5 MG/ML INJ: 10.0000 mg | INTRAVENOUS | Status: DC | PRN

## 2021-03-29 MED ORDER — LIDOCAINE HCL (PF) 1 % IJ SOLN
INTRAMUSCULAR | Status: DC | PRN
  Administered 2021-03-29 (×2): 5 mL via EPIDURAL

## 2021-03-29 MED ORDER — FENTANYL-BUPIVACAINE-NACL 0.5-0.125-0.9 MG/250ML-% EP SOLN
12.0000 mL/h | EPIDURAL | Status: DC | PRN
  Administered 2021-03-29: 12 mL/h via EPIDURAL
  Filled 2021-03-29: qty 250

## 2021-03-29 MED ORDER — PHENYLEPHRINE 40 MCG/ML (10ML) SYRINGE FOR IV PUSH (FOR BLOOD PRESSURE SUPPORT)
80.0000 ug | PREFILLED_SYRINGE | INTRAVENOUS | Status: DC | PRN
  Filled 2021-03-29: qty 10

## 2021-03-29 MED ORDER — SIMETHICONE 80 MG PO CHEW: 80.0000 mg | CHEWABLE_TABLET | ORAL | Status: DC | PRN

## 2021-03-29 MED ORDER — IBUPROFEN 600 MG PO TABS
600.0000 mg | ORAL_TABLET | Freq: Four times a day (QID) | ORAL | Status: DC
  Administered 2021-03-29 – 2021-03-30 (×3): 600 mg via ORAL
  Filled 2021-03-29 (×3): qty 1

## 2021-03-29 MED ORDER — ZOLPIDEM TARTRATE 5 MG PO TABS
5.0000 mg | ORAL_TABLET | Freq: Every evening | ORAL | Status: DC | PRN
  Administered 2021-03-29: 5 mg via ORAL
  Filled 2021-03-29: qty 1

## 2021-03-29 MED ORDER — DIPHENHYDRAMINE HCL 50 MG/ML IJ SOLN: 12.5000 mg | INTRAMUSCULAR | Status: DC | PRN

## 2021-03-29 MED ORDER — COCONUT OIL OIL: 1.0000 "application " | TOPICAL_OIL | Status: DC | PRN

## 2021-03-29 NOTE — Progress Notes (Signed)
Hannah Roman is a 26 y.o. G1P0 at [redacted]w[redacted]d by ultrasound admitted for induction of labor due to Post dates. Due date 03/27/21.  Following CLEA placement, spontaneous deceleration x 5 minutes which resolved with IVF bolus and position change.  Patient continued to have frequent CTX q 1-2 minutes and was given terbutaline.  Patient again, had decelerations which would temporarily improve with position change.  BP was noted to be lower than baseline and ephedrine x 1 given as well as a second dose of terbutaline.  Tracing improved following these measures.   Subjective: Comfortable with CLEA  Objective: BP 111/67    Pulse (!) 112    Temp 98 F (36.7 C) (Oral)    Resp 18    Ht 5\' 4"  (1.626 m)    Wt 64.1 kg Comment: 141.3   BMI 24.25 kg/m  No intake/output data recorded. No intake/output data recorded.  FHT:  FHR: 150 bpm, variability: moderate,  accelerations:  Present,  decelerations:  Absent UC:   regular, every 2 minutes SVE:   Dilation: 3 Effacement (%): 100 Station: 0 Exam by:: Dickey Caamano MD AROM, clear  Labs: Lab Results  Component Value Date   WBC 11.4 (H) 03/29/2021   HGB 13.1 03/29/2021   HCT 38.2 03/29/2021   MCV 93.6 03/29/2021   PLT 143 (L) 03/29/2021    Assessment / Plan: Induction of labor s/p VMP x 2; now s/p AROM  Labor: Progressing normally Preeclampsia:   n/a Fetal Wellbeing:  Category I Pain Control:  Epidural I/D:  n/a Anticipated MOD:  NSVD  Hannah Roman 03/29/2021, 12:57 PM

## 2021-03-29 NOTE — Anesthesia Procedure Notes (Signed)
Epidural Patient location during procedure: OB Start time: 03/29/2021 9:25 AM End time: 03/29/2021 9:33 AM  Preanesthetic Checklist Completed: patient identified, IV checked, site marked, risks and benefits discussed, surgical consent, monitors and equipment checked, pre-op evaluation and timeout performed  Epidural Patient position: sitting Prep: DuraPrep and site prepped and draped Patient monitoring: continuous pulse ox and blood pressure Approach: midline Location: L3-L4 Injection technique: LOR air  Needle:  Needle type: Tuohy  Needle gauge: 17 G Needle length: 9 cm and 9 Needle insertion depth: 4 cm Catheter type: closed end flexible Catheter size: 19 Gauge Catheter at skin depth: 9 cm Test dose: negative and Other  Assessment Events: blood not aspirated, injection not painful, no injection resistance, no paresthesia and negative IV test  Additional Notes Patient identified. Risks and benefits discussed including failed block, incomplete  Pain control, post dural puncture headache, nerve damage, paralysis, blood pressure Changes, nausea, vomiting, reactions to medications-both toxic and allergic and post Partum back pain. All questions were answered. Patient expressed understanding and wished to proceed. Sterile technique was used throughout procedure. Epidural site was Dressed with sterile barrier dressing. No paresthesias, signs of intravascular injection Or signs of intrathecal spread were encountered.  Patient was more comfortable after the epidural was dosed. Please see RN's note for documentation of vital signs and FHR which are stable. Reason for block:procedure for pain

## 2021-03-29 NOTE — Plan of Care (Signed)
Problem: Education: °Goal: Knowledge of Childbirth will improve °Outcome: Completed/Met °Goal: Ability to make informed decisions regarding treatment and plan of care will improve °Outcome: Completed/Met °Goal: Ability to state and carry out methods to decrease the pain will improve °Outcome: Completed/Met °Goal: Individualized Educational Video(s) °Outcome: Completed/Met °  °Problem: Coping: °Goal: Ability to verbalize concerns and feelings about labor and delivery will improve °Outcome: Completed/Met °  °Problem: Life Cycle: °Goal: Ability to make normal progression through stages of labor will improve °Outcome: Completed/Met °Goal: Ability to effectively push during vaginal delivery will improve °Outcome: Completed/Met °  °Problem: Role Relationship: °Goal: Will demonstrate positive interactions with the child °Outcome: Completed/Met °  °Problem: Safety: °Goal: Risk of complications during labor and delivery will decrease °Outcome: Completed/Met °  °Problem: Pain Management: °Goal: Relief or control of pain from uterine contractions will improve °Outcome: Completed/Met °  °Problem: Education: °Goal: Knowledge of General Education information will improve °Description: Including pain rating scale, medication(s)/side effects and non-pharmacologic comfort measures °Outcome: Completed/Met °  °Problem: Health Behavior/Discharge Planning: °Goal: Ability to manage health-related needs will improve °Outcome: Completed/Met °  °Problem: Clinical Measurements: °Goal: Ability to maintain clinical measurements within normal limits will improve °Outcome: Completed/Met °Goal: Will remain free from infection °Outcome: Completed/Met °Goal: Diagnostic test results will improve °Outcome: Completed/Met °Goal: Respiratory complications will improve °Outcome: Completed/Met °Goal: Cardiovascular complication will be avoided °Outcome: Completed/Met °  °Problem: Activity: °Goal: Risk for activity intolerance will decrease °Outcome:  Completed/Met °  °Problem: Nutrition: °Goal: Adequate nutrition will be maintained °Outcome: Completed/Met °  °Problem: Coping: °Goal: Level of anxiety will decrease °Outcome: Completed/Met °  °Problem: Elimination: °Goal: Will not experience complications related to bowel motility °Outcome: Completed/Met °Goal: Will not experience complications related to urinary retention °Outcome: Completed/Met °  °Problem: Pain Managment: °Goal: General experience of comfort will improve °Outcome: Completed/Met °  °Problem: Safety: °Goal: Ability to remain free from injury will improve °Outcome: Completed/Met °  °Problem: Skin Integrity: °Goal: Risk for impaired skin integrity will decrease °Outcome: Completed/Met °  °

## 2021-03-29 NOTE — H&P (Signed)
Hannah Roman is a 26 y.o. female G1 at [redacted]w[redacted]d presenting for IOL.  Patient received her second dose of VMP at 0440.  She reports strong and regular CTX x 2 hours or so. Active FM.  Antepartum course complicated by anxiety; no medication.  GBS negative.  Last u/s 1/19 with EFW 6#6 (25%).    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 Social History:  reports that she has never smoked. She has never used smokeless tobacco. She reports that she does not currently use alcohol. She reports that she does not use drugs.     Maternal Diabetes: No Genetic Screening: Normal Maternal Ultrasounds/Referrals: Normal Fetal Ultrasounds or other Referrals:  None Maternal Substance Abuse:  No Significant Maternal Medications:  None Significant Maternal Lab Results:  Group B Strep negative Other Comments:  None  Review of Systems Maternal Medical History:  Reason for admission: Contractions.   Contractions: Onset was 3-5 hours ago.   Frequency: regular.   Perceived severity is moderate.   Fetal activity: Perceived fetal activity is normal.   Last perceived fetal movement was within the past hour.   Prenatal complications: no prenatal complications Prenatal Complications - Diabetes: none.  Dilation: 1 Effacement (%): 90 Station:  -1 Exam by:: Hannah Caggiano MD Blood pressure 110/74, pulse 78, temperature 97.8 F (36.6 C), temperature source Oral, resp. rate 18, height 5\' 4"  (1.626 m), weight 64.1 kg. Maternal Exam:  Uterine Assessment: Contraction strength is moderate.  Contraction frequency is regular.  Abdomen: Patient reports no abdominal tenderness. Fundal height is c/w dates.   Estimated fetal weight is 7#.   Fetal presentation: vertex Introitus: Normal vulva. Pelvis: adequate for delivery.   Cervix: Cervix evaluated by digital exam.     Fetal Exam Fetal Monitor Review: Baseline rate: 145.  Variability: moderate (6-25 bpm).   Pattern: accelerations present and no decelerations.   Fetal State Assessment: Category I - tracings are normal.  Physical Exam Constitutional:      Appearance: Normal appearance.  HENT:     Head: Normocephalic and atraumatic.  Pulmonary:     Effort: Pulmonary effort is normal.  Abdominal:     Palpations: Abdomen is soft.  Genitourinary:    General: Normal vulva.  Musculoskeletal:        General: Normal range of motion.     Cervical back: Normal range of motion.  Skin:    General: Skin is warm and dry.  Neurological:     Mental Status: She is alert and oriented to person, place,  and time.  Psychiatric:        Mood and Affect: Mood normal.        Behavior: Behavior normal.    Prenatal labs: ABO, Rh: --/--/O POS (02/07 0020) Antibody: NEG (02/07 0020) Rubella: Immune (06/28 0000) RPR: Nonreactive (06/28 0000)  HBsAg: Negative (06/28 0000)  HIV: Non-reactive (06/28 0000)  GBS: Negative/-- (01/12 0000)   Assessment/Plan: 25yo G1 at [redacted]w[redacted]d for IOL -Patient requests CLEA; will place -Frequent CTX at this time, will add pitocin prn -Anticipate NSVD   Hannah Roman 03/29/2021, 8:58 AM

## 2021-03-29 NOTE — Anesthesia Preprocedure Evaluation (Signed)
Anesthesia Evaluation  Patient identified by MRN, date of birth, ID band Patient awake    Reviewed: Allergy & Precautions, Patient's Chart, lab work & pertinent test results  Airway Mallampati: II  TM Distance: >3 FB Neck ROM: Full    Dental no notable dental hx.    Pulmonary neg pulmonary ROS,    Pulmonary exam normal breath sounds clear to auscultation       Cardiovascular negative cardio ROS Normal cardiovascular exam Rhythm:Regular Rate:Normal     Neuro/Psych PSYCHIATRIC DISORDERS Anxiety Depression negative neurological ROS     GI/Hepatic Neg liver ROS, GERD  ,  Endo/Other  negative endocrine ROS  Renal/GU negative Renal ROS  negative genitourinary   Musculoskeletal negative musculoskeletal ROS (+)   Abdominal   Peds  Hematology negative hematology ROS (+)   Anesthesia Other Findings   Reproductive/Obstetrics (+) Pregnancy                             Anesthesia Physical Anesthesia Plan  ASA: 2  Anesthesia Plan: Epidural   Post-op Pain Management:    Induction:   PONV Risk Score and Plan:   Airway Management Planned: Natural Airway  Additional Equipment:   Intra-op Plan:   Post-operative Plan:   Informed Consent: I have reviewed the patients History and Physical, chart, labs and discussed the procedure including the risks, benefits and alternatives for the proposed anesthesia with the patient or authorized representative who has indicated his/her understanding and acceptance.     Dental advisory given  Plan Discussed with: Anesthesiologist and CRNA  Anesthesia Plan Comments:         Anesthesia Quick Evaluation

## 2021-03-29 NOTE — Progress Notes (Signed)
80 mcg of Phenylephrine administered IV per Dr Langston Masker.  Pt BP slightly lower than her baseline and repeat late decelerations noted on fetal HR tracing despite position changes and IV fluid resuscitation.

## 2021-03-29 NOTE — Lactation Note (Signed)
This note was copied from a baby's chart. Lactation Consultation Note  Patient Name: Hannah Roman [id number] Date: 03/29/2021 Reason for consult: Initial assessment Age:26 years P1, term female infant. Mom had breastfeed infant for 20 minutes on her left breast prior to [facility] entering room, mom switched to her left breast using the cross cradle hold, infant BF for additional 5 minutes, total feeding was 25 minutes. Mom feels breastfeeding is going well. After infant latched at breast dad did skin to skin so mom could rest. Mom made aware of O/P services, breastfeeding support groups, community resources, and our phone # for post-discharge questions.   Mom's plan: 1- Mom will continue to breastfeed infant according to hunger cues, 8 to 12+ or more times within 24 hours, skin to skin. 2- Mom will attempt to latch infant on both breast during a feeding. 3- Mom knows to ask RN/LC for further latch assistance if needed. Maternal Data Has patient been taught Hand Expression?: Yes  Feeding Mother's Current Feeding Choice: Breast Milk  LATCH Score Latch: Grasps breast easily, tongue down, lips flanged, rhythmical sucking.  Audible Swallowing: A few with stimulation  Type of Nipple: Everted at rest and after stimulation  Comfort (Breast/Nipple): Soft / non-tender  Hold (Positioning): Assistance needed to correctly position infant at breast and maintain latch.  LATCH Score: 8   Lactation Tools Discussed/Used    Interventions Interventions: Breast feeding basics reviewed;Assisted with latch;Skin to skin;Hand express;Adjust position;Support pillows;Position options;Expressed milk;Education;LC Services brochure  Discharge    Consult Status Consult Status: Follow-up Date: 03/30/21 Follow-up type: In-patient    Danelle Earthly 03/29/2021, 9:39 PM

## 2021-03-30 LAB — CBC
HCT: 32.4 % — ABNORMAL LOW (ref 36.0–46.0)
Hemoglobin: 10.9 g/dL — ABNORMAL LOW (ref 12.0–15.0)
MCH: 32.2 pg (ref 26.0–34.0)
MCHC: 33.6 g/dL (ref 30.0–36.0)
MCV: 95.6 fL (ref 80.0–100.0)
Platelets: 120 10*3/uL — ABNORMAL LOW (ref 150–400)
RBC: 3.39 MIL/uL — ABNORMAL LOW (ref 3.87–5.11)
RDW: 13.2 % (ref 11.5–15.5)
WBC: 12.7 10*3/uL — ABNORMAL HIGH (ref 4.0–10.5)
nRBC: 0 % (ref 0.0–0.2)

## 2021-03-30 NOTE — Lactation Note (Signed)
This note was copied from a baby's chart. Lactation Consultation Note  Patient Name: Hannah Roman [id number] Date: 03/30/2021 Reason for consult: Follow-up assessment;1st time breastfeeding;Primapara;Term Age:26 hours   P1 mother whose infant is now 44 hours old.  This is a term baby at 40+2 weeks.  Mother's current feeding preference is breast.  Mother had just finished a 17 minute feeding prior to my arrival.  Randel Books was still showing cues.  Offered to assist/observe and mother receptive.  Assisted to latch easily in the cross cradle position.  Baby continued to feed with intermittent swallows for an additional 10 minutes before tiring.  Placed her STS on mother's chest and she fell asleep.  Reviewed breast feeding basics and provided helpful hints for positioning and latching.  Both parents very receptive to all teaching.  Parents desire a discharge home today.  RN updated and she will call the pediatrician after the 24 hour screens have been completed.     Maternal Data    Feeding Mother's Current Feeding Choice: Breast Milk  LATCH Score Latch: Grasps breast easily, tongue down, lips flanged, rhythmical sucking.  Audible Swallowing: Spontaneous and intermittent  Type of Nipple: Everted at rest and after stimulation  Comfort (Breast/Nipple): Soft / non-tender  Hold (Positioning): Assistance needed to correctly position infant at breast and maintain latch.  LATCH Score: 9   Lactation Tools Discussed/Used    Interventions Interventions: Breast feeding basics reviewed;Assisted with latch;Skin to skin;Breast massage;Hand express;Adjust position;Hand pump;Coconut oil;Position options;Support pillows;Education  Discharge Discharge Education: Engorgement and breast care  Consult Status Consult Status: Complete Date: 03/30/21 Follow-up type: Call as needed    Lanice Schwab Markelle Najarian 03/30/2021, 12:31 PM

## 2021-03-30 NOTE — Anesthesia Postprocedure Evaluation (Signed)
Anesthesia Post Note  Patient: Hawraa Stambaugh  Procedure(s) Performed: AN AD HOC LABOR EPIDURAL     Patient location during evaluation: Mother Baby Anesthesia Type: Epidural Level of consciousness: awake, oriented and awake and alert Pain management: pain level controlled Vital Signs Assessment: post-procedure vital signs reviewed and stable Respiratory status: spontaneous breathing, respiratory function stable and nonlabored ventilation Cardiovascular status: stable Postop Assessment: adequate PO intake, no headache, able to ambulate, patient able to bend at knees and no apparent nausea or vomiting Anesthetic complications: no   No notable events documented.  Last Vitals:  Vitals:   03/29/21 2345 03/30/21 0505  BP: 109/73 101/65  Pulse: 90 88  Resp: 18 20  Temp: 36.8 C 36.7 C  SpO2: 99% 100%    Last Pain:  Vitals:   03/30/21 0722  TempSrc:   PainSc: 0-No pain   Pain Goal: Patients Stated Pain Goal: 0 (03/30/21 0505)                 Crosley Stejskal

## 2021-03-30 NOTE — Social Work (Signed)
MOB was referred for history of panic attacks and anxiety.  ° °* Referral screened out by Clinical Social Worker because none of the following criteria appear to apply: ° °~ History of anxiety/depression during this pregnancy, or of post-partum depression following prior delivery. No prenatal concerns noted, MOB has been off medicaitons 6 years per prenatal records.  °~ Diagnosis of anxiety and/or depression within last 3 years. Per chart review, diagnosis date of 2018 or earlier.  °OR °* MOB's symptoms currently being treated with medication and/or therapy. ° °MOB received a 1 on the Edinburgh Postpartum Depression Scale.  ° °Please contact the Clinical Social Worker if needs arise or by MOB request. ° °Jordan Caraveo, LCSWA °Clinical Social Work °[facility]  °[phone]  °

## 2021-03-30 NOTE — Discharge Summary (Signed)
Postpartum Discharge Summary     Patient Name: Hannah Roman DOB: [birth year] MRN: [id number]  Date of admission: 03/29/2021 Delivery date:03/29/2021  Delivering provider: Linda Hedges  Date of discharge: 03/30/2021  Admitting diagnosis: Pregnancy [Z34.90] Intrauterine pregnancy: [redacted]w[redacted]d     Secondary diagnosis:  Principal Problem:   Pregnancy  Additional problems: None    Discharge diagnosis: Term Pregnancy Delivered                                              Post partum procedures: None Augmentation: AROM, Pitocin, and Cytotec Complications: None  Hospital course: Induction of Labor With Vaginal Delivery   26 y.o. yo G1P1001 at [redacted]w[redacted]d was admitted to the hospital 03/29/2021 for induction of labor.  Indication for induction: Postdates.  Patient had an uncomplicated labor course as follows: Membrane Rupture Time/Date: 12:45 PM ,03/29/2021   Delivery Method:Vaginal, Spontaneous  Episiotomy: None  Lacerations:  2nd degree;Periurethral  Details of delivery can be found in separate delivery note.  Patient had a routine postpartum course. Patient is discharged home 03/30/21.  Newborn Data: Birth date:03/29/2021  Birth time:2:31 PM  Gender:Female  Living status:Living  Apgars:8 ,9  Weight:3033 g   Magnesium Sulfate received: No BMZ received: No  Physical exam  Vitals:   03/29/21 1720 03/29/21 2050 03/29/21 2345 03/30/21 0505  BP: 103/71 105/73 109/73 101/65  Pulse: 87 80 90 88  Resp: 20 18 18 20   Temp: 98.4 F (36.9 C) 98.2 F (36.8 C) 98.2 F (36.8 C) 98 F (36.7 C)  TempSrc: Oral Oral Oral Oral  SpO2:  100% 99% 100%  Weight:      Height:       General: alert, cooperative, and no distress Lochia: appropriate Uterine Fundus: firm Incision: N/A DVT Evaluation: No evidence of DVT seen on physical exam. Labs: Lab Results  Component Value Date   WBC 12.7 (H) 03/30/2021   HGB 10.9 (L) 03/30/2021   HCT 32.4 (L) 03/30/2021   MCV 95.6 03/30/2021   PLT 120 (L)  03/30/2021   CMP Latest Ref Rng & Units 10/08/2017  Glucose 70 - 99 mg/dL 76  BUN 6 - 23 mg/dL 15  Creatinine 0.40 - 1.20 mg/dL 0.95  Sodium 135 - 145 mEq/L 140  Potassium 3.5 - 5.1 mEq/L 4.3  Chloride 96 - 112 mEq/L 107  CO2 19 - 32 mEq/L 27  Calcium 8.4 - 10.5 mg/dL 9.9  Total Protein 6.0 - 8.3 g/dL 6.9  Total Bilirubin 0.2 - 1.2 mg/dL 0.5  Alkaline Phos 39 - 117 U/L 58  AST 0 - 37 U/L 17  ALT 0 - 35 U/L 18   Edinburgh Score: Edinburgh Postnatal Depression Scale Screening Tool 03/29/2021  I have been able to laugh and see the funny side of things. 0  I have looked forward with enjoyment to things. 0  I have blamed myself unnecessarily when things went wrong. 0  I have been anxious or worried for no good reason. 0  I have felt scared or panicky for no good reason. 0  Things have been getting on top of me. 1  I have been so unhappy that I have had difficulty sleeping. 0  I have felt sad or miserable. 0  I have been so unhappy that I have been crying. 0  The thought of harming myself has occurred to me. 0  Edinburgh Postnatal Depression Scale Total 1      After visit meds:  Allergies as of 03/30/2021       Reactions   Penicillins Hives   Childhood reaction    Lexapro [escitalopram Oxalate]    GI symptoms         Medication List     STOP taking these medications    metroNIDAZOLE 0.75 % vaginal gel Commonly known as: METROGEL VAGINAL   nitrofurantoin (macrocrystal-monohydrate) 100 MG capsule Commonly known as: MACROBID       TAKE these medications    FLUoxetine 20 MG capsule Commonly known as: PROZAC Take 1 capsule (20 mg total) by mouth at bedtime.   prenatal multivitamin Tabs tablet Take 1 tablet by mouth daily at 12 noon.         Discharge home in stable condition Infant Feeding: Bottle Infant Disposition:home with mother Discharge instruction: per After Visit Summary and Postpartum booklet. Activity: Advance as tolerated. Pelvic rest for 6  weeks.  Diet: routine diet Anticipated Birth Control: Unsure Postpartum Appointment:6 weeks Additional Postpartum F/U:  None Future Appointments:No future appointments. Follow up Visit:      03/30/2021 Tyson Dense, MD

## 2021-03-30 NOTE — Progress Notes (Signed)
Post Partum Day 1 Subjective: no complaints, up ad lib, voiding, and tolerating PO  Objective: Blood pressure 101/65, pulse 88, temperature 98 F (36.7 C), temperature source Oral, resp. rate 20, height 5\' 4"  (1.626 m), weight 64.1 kg, SpO2 100 %, unknown if currently breastfeeding.  Physical Exam:  General: alert Lochia: appropriate Uterine Fundus: firm Incision: N/A DVT Evaluation: No evidence of DVT seen on physical exam.  Recent Labs    03/29/21 0020 03/30/21 0433  HGB 13.1 10.9*  HCT 38.2 32.4*    Assessment/Plan: Plan for discharge tomorrow/ Doing well and meeting all goals thus far.    LOS: 1 day   05/28/21 03/30/2021, 9:27 AM

## 2021-04-08 ENCOUNTER — Telehealth (HOSPITAL_COMMUNITY): Payer: Self-pay | Admitting: *Deleted

## 2021-04-08 NOTE — Telephone Encounter (Signed)
Phone voicemail message left to return nurse call.  Duffy Rhody, RN 04-08-2021 at 10:09am

## 2021-11-14 ENCOUNTER — Encounter: Payer: Self-pay | Admitting: *Deleted

## 2023-10-10 ENCOUNTER — Encounter: Payer: Self-pay | Admitting: Nurse Practitioner

## 2023-10-10 ENCOUNTER — Ambulatory Visit: Admitting: Nurse Practitioner

## 2023-10-10 ENCOUNTER — Other Ambulatory Visit: Payer: Self-pay

## 2023-10-10 VITALS — BP 118/72 | HR 86 | Temp 98.4°F | Resp 18 | Ht 64.0 in | Wt 135.8 lb

## 2023-10-10 DIAGNOSIS — Z130 Encounter for screening for diseases of the blood and blood-forming organs and certain disorders involving the immune mechanism: Secondary | ICD-10-CM

## 2023-10-10 DIAGNOSIS — R5383 Other fatigue: Secondary | ICD-10-CM

## 2023-10-10 DIAGNOSIS — R4189 Other symptoms and signs involving cognitive functions and awareness: Secondary | ICD-10-CM

## 2023-10-10 DIAGNOSIS — F419 Anxiety disorder, unspecified: Secondary | ICD-10-CM

## 2023-10-10 DIAGNOSIS — Z1159 Encounter for screening for other viral diseases: Secondary | ICD-10-CM

## 2023-10-10 DIAGNOSIS — Z114 Encounter for screening for human immunodeficiency virus [HIV]: Secondary | ICD-10-CM

## 2023-10-10 DIAGNOSIS — Z7689 Persons encountering health services in other specified circumstances: Secondary | ICD-10-CM

## 2023-10-10 DIAGNOSIS — F331 Major depressive disorder, recurrent, moderate: Secondary | ICD-10-CM | POA: Diagnosis not present

## 2023-10-10 DIAGNOSIS — L659 Nonscarring hair loss, unspecified: Secondary | ICD-10-CM | POA: Diagnosis not present

## 2023-10-10 DIAGNOSIS — R41840 Attention and concentration deficit: Secondary | ICD-10-CM | POA: Diagnosis not present

## 2023-10-10 DIAGNOSIS — Z131 Encounter for screening for diabetes mellitus: Secondary | ICD-10-CM

## 2023-10-10 DIAGNOSIS — Z13220 Encounter for screening for lipoid disorders: Secondary | ICD-10-CM

## 2023-10-10 NOTE — Progress Notes (Signed)
 BP 118/72 (Cuff Size: Normal)   Pulse 86   Temp 98.4 F (36.9 C) (Oral)   Resp 18   Ht 5' 4 (1.626 m)   Wt 135 lb 12.8 oz (61.6 kg)   LMP 09/21/2023   SpO2 97%   BMI 23.31 kg/m    Subjective:    Patient ID: Hannah Roman, female    DOB: [birth year], 28 y.o.   MRN: [id number]  HPI: Hannah Roman is a 28 y.o. female  Chief Complaint  Patient presents with   Establish Care   ADHD    consult   Fatigue    And hair loss    Discussed the use of AI scribe software for clinical note transcription with the patient, who gave verbal consent to proceed.  History of Present Illness Hannah Roman is a 28 year old female who presents with concerns of fatigue, hair loss, and possible ADHD.  Fatigue and cognitive impairment - Persistent fatigue despite 6 to 8 hours of sleep per night - Associated with significant brain fog and a feeling of heaviness - No snoring  Alopecia - Hair loss began approximately one year after childbirth in 2023 - Localized to specific areas on the scalp  Menstrual irregularities and menorrhagia - Very heavy and crampy menstrual periods lasting about one week - Menstrual cycles have become more regular and heavier since childbirth - Prior to childbirth, periods were more infrequent and less severe  Difficulty with attention and focus - Difficulty focusing on small tasks and a scattered mind - Concern for possible ADHD - Symptoms observed by her husband, who has ADHD - Discussed concerns with her therapist  Mood disturbances - History of depression and anxiety - Previously trialed Wellbutrin (not effective), Lexapro  (allergic reaction), and Prozac  (not continued long enough to assess efficacy) - Concerned about medication side effects, especially since having a child     Flowsheet Row Office Visit from 10/10/2023 in [facility]  1 33 inches       10/10/2023    8:19 AM 10/10/2023    7:45 AM 10/08/2017    8:29 AM   Depression screen PHQ 2/9  Decreased Interest 3 0 1  Down, Depressed, Hopeless 1 0 1  PHQ - 2 Score 4 0 2  Altered sleeping 2  1  Tired, decreased energy 3  1  Change in appetite 2  0  Feeling bad or failure about yourself  2  0  Trouble concentrating 3  2  Moving slowly or fidgety/restless 1  0  Suicidal thoughts 0  0  PHQ-9 Score 17  6  Difficult doing work/chores Very difficult  Somewhat difficult       10/10/2023    8:18 AM 10/08/2017    8:31 AM 02/28/2017    8:59 AM 01/05/2017    8:15 AM  GAD 7 : Generalized Anxiety Score  Nervous, Anxious, on Edge 0 2 3 2   Control/stop worrying 0 3 3 3   Worry too much - different things 1 3 3 3   Trouble relaxing 1 3 3 3   Restless 0 0 2 1  Easily annoyed or irritable 2 2 2 3   Afraid - awful might happen 0 1 2 3   Total GAD 7 Score 4 14 18 18   Anxiety Difficulty Somewhat difficult   Somewhat difficult     Relevant past medical, surgical, family and social history reviewed and updated as indicated. Interim medical history since our last visit reviewed. Allergies and medications  reviewed and updated.  Review of Systems  Ten systems reviewed and is negative except as mentioned in HPI      Objective:     BP 118/72 (Cuff Size: Normal)   Pulse 86   Temp 98.4 F (36.9 C) (Oral)   Resp 18   Ht 5' 4 (1.626 m)   Wt 135 lb 12.8 oz (61.6 kg)   LMP 09/21/2023   SpO2 97%   BMI 23.31 kg/m    Wt Readings from Last 3 Encounters:  10/10/23 135 lb 12.8 oz (61.6 kg)  03/29/21 141 lb 4.8 oz (64.1 kg)  01/21/21 136 lb 9.6 oz (62 kg)    Physical Exam Physical Exam GENERAL: Alert, cooperative, well developed, no acute distress HEENT: Normocephalic, normal oropharynx, moist mucous membranes CHEST: Clear to auscultation bilaterally, No wheezes, rhonchi, or crackles CARDIOVASCULAR: Normal heart rate and rhythm, S1 and S2 normal without murmurs ABDOMEN: Soft, non-tender, non-distended, without organomegaly, Normal bowel sounds EXTREMITIES: No  cyanosis or edema NEUROLOGICAL: Cranial nerves grossly intact, Moves all extremities without gross motor or sensory deficit           Assessment & Plan:   Problem List Items Addressed This Visit       Other   Anxiety   Moderate episode of recurrent major depressive disorder (HCC) - Primary   Other Visit Diagnoses       Other fatigue       Relevant Orders   CBC with Differential/Platelet   Comprehensive metabolic panel with GFR   TSH   Hemoglobin A1c   VITAMIN D  25 Hydroxy (Vit-D Deficiency, Fractures)   B12 and Folate Panel   Iron, TIBC and Ferritin Panel   Analyzer ANA IFA w/RFLX Titer/Pattern,Systemic Autoimmune Panel 1     Hair loss       Relevant Orders   CBC with Differential/Platelet   Comprehensive metabolic panel with GFR   TSH   Hemoglobin A1c   VITAMIN D  25 Hydroxy (Vit-D Deficiency, Fractures)   B12 and Folate Panel   Iron, TIBC and Ferritin Panel   Analyzer ANA IFA w/RFLX Titer/Pattern,Systemic Autoimmune Panel 1     Inattention       Relevant Orders   Ambulatory referral to Psychology     Screening for deficiency anemia       Relevant Orders   CBC with Differential/Platelet     Screening for HIV without presence of risk factors       Relevant Orders   HIV Antibody (routine testing w rflx)     Screening for cholesterol level       Relevant Orders   Lipid panel     Screening for diabetes mellitus       Relevant Orders   Comprehensive metabolic panel with GFR   Hemoglobin A1c     Encounter for hepatitis C screening test for low risk patient       Relevant Orders   Hepatitis C antibody     Brain fog       Relevant Orders   CBC with Differential/Platelet   Comprehensive metabolic panel with GFR   TSH   Hemoglobin A1c   VITAMIN D  25 Hydroxy (Vit-D Deficiency, Fractures)   B12 and Folate Panel   Iron, TIBC and Ferritin Panel   Analyzer ANA IFA w/RFLX Titer/Pattern,Systemic Autoimmune Panel 1     Encounter to establish care             Assessment and Plan Assessment & Plan Fatigue and  nonscarring hair loss Fatigue and nonscarring hair loss. Hair loss is isolated to specific areas and began about a year after childbirth. Fatigue persists despite adequate sleep, accompanied by brain fog and a feeling of heaviness.  - Order autoimmune panel - Order CBC - Order metabolic panel - Order TSH - Order vitamin D , B12, and folate levels - Order iron panel including ferritin level  Heavy menstrual bleeding Heavy menstrual bleeding with increased cramping since childbirth. Previously, periods were infrequent but are now regular and last about a week.  Possible attention-deficit/hyperactivity disorder (ADHD) Concerns about inattention and difficulty focusing, possibly indicative of ADHD. Symptoms have become more noticeable with increased responsibilities after having a child. Family history of ADHD noted in husband. - Refer to [facility] for ADHD testing  Anxiety and depression Depression and anxiety with previous trials of Wellbutrin, Lexapro , and Prozac . Prozac  was beneficial, but adherence was an issue. Concerns about medication side effects, especially with a child. Interest in gene site testing to guide medication choices. Informed consent for gene site testing discussed, including the process and limitations. The test categorizes medications into green, yellow, and red piles based on compatibility with her DNA, but does not guarantee absence of side effects or allergic reactions. - Perform gene site testing for psychotropic medication guidance  Brain fog Brain fog associated with fatigue.  In attention Inattention possibly related to ADHD or other underlying conditions.        Follow up plan: Return in about 5 weeks (around 11/14/2023) for follow up.

## 2023-10-11 ENCOUNTER — Ambulatory Visit: Payer: Self-pay | Admitting: Nurse Practitioner

## 2023-10-11 DIAGNOSIS — E538 Deficiency of other specified B group vitamins: Secondary | ICD-10-CM

## 2023-10-11 DIAGNOSIS — R4189 Other symptoms and signs involving cognitive functions and awareness: Secondary | ICD-10-CM

## 2023-10-11 DIAGNOSIS — R5383 Other fatigue: Secondary | ICD-10-CM

## 2023-10-11 DIAGNOSIS — R768 Other specified abnormal immunological findings in serum: Secondary | ICD-10-CM

## 2023-10-15 ENCOUNTER — Other Ambulatory Visit: Payer: Self-pay | Admitting: Nurse Practitioner

## 2023-10-15 ENCOUNTER — Encounter: Payer: Self-pay | Admitting: Nurse Practitioner

## 2023-10-15 DIAGNOSIS — F331 Major depressive disorder, recurrent, moderate: Secondary | ICD-10-CM

## 2023-10-15 DIAGNOSIS — F419 Anxiety disorder, unspecified: Secondary | ICD-10-CM

## 2023-10-15 MED ORDER — FLUOXETINE HCL 20 MG PO CAPS: 20.0000 mg | ORAL_CAPSULE | Freq: Every day | ORAL | 0 refills | Status: DC

## 2023-10-18 LAB — CBC WITH DIFFERENTIAL/PLATELET
Absolute Lymphocytes: 2096 {cells}/uL (ref 850–3900)
Absolute Monocytes: 378 {cells}/uL (ref 200–950)
Basophils Absolute: 50 {cells}/uL (ref 0–200)
Basophils Relative: 0.8 %
Eosinophils Absolute: 167 {cells}/uL (ref 15–500)
Eosinophils Relative: 2.7 %
HCT: 44.7 % (ref 35.0–45.0)
Hemoglobin: 15 g/dL (ref 11.7–15.5)
MCH: 32.5 pg (ref 27.0–33.0)
MCHC: 33.6 g/dL (ref 32.0–36.0)
MCV: 97 fL (ref 80.0–100.0)
MPV: 11.6 fL (ref 7.5–12.5)
Monocytes Relative: 6.1 %
Neutro Abs: 3509 {cells}/uL (ref 1500–7800)
Neutrophils Relative %: 56.6 %
Platelets: 220 Thousand/uL (ref 140–400)
RBC: 4.61 Million/uL (ref 3.80–5.10)
RDW: 13.4 % (ref 11.0–15.0)
Total Lymphocyte: 33.8 %
WBC: 6.2 Thousand/uL (ref 3.8–10.8)

## 2023-10-18 LAB — LIPID PANEL
Cholesterol: 191 mg/dL (ref <200)
HDL: 65 mg/dL (ref >=50)
LDL Cholesterol (Calc): 111 mg/dL — ABNORMAL HIGH
Non-HDL Cholesterol (Calc): 126 mg/dL (ref <130)
Total CHOL/HDL Ratio: 2.9 (calc) (ref <5.0)
Triglycerides: 65 mg/dL (ref <150)

## 2023-10-18 LAB — ANALYZER(R)ANA IFA WITH REFLEX TITER/PATTRN,SYS AUTOIMM PNL1
Anti Nuclear Antibody (ANA): POSITIVE — AB
Anticardiolipin IgA: <2 [APL'U]/mL
Anticardiolipin IgG: <2 [GPL'U]/mL
Anticardiolipin IgM: <2 [MPL'U]/mL
Beta-2 Glyco 1 IgA: <2 U/mL
Beta-2 Glyco 1 IgM: <2 U/mL
Beta-2 Glyco I IgG: <2 U/mL
C3 Complement: 91 mg/dL (ref 83–193)
C4 Complement: 16 mg/dL (ref 15–57)
Centromere Ab Screen: 3 AI — AB
Chromatin (Nucleosomal) Antibody: <1 AI
Cyclic Citrullin Peptide Ab: <16 U
DNA Ab (DS) Crithidia, IFA: NEGATIVE
ENA SM Ab Ser-aCnc: <1 AI
Jo-1 Autoabs: <1 AI
MUTATED CITRULLINATED VIMENTIN (MCV) AB: <20 U/mL (ref <20)
Rheumatoid Factor (IgA): <5 U
Rheumatoid Factor (IgG): <5 U
Rheumatoid Factor (IgM): 5 U
Ribonucleic Protein(ENA) Antibody, IgG: <1 AI
SM/RNP: <1 AI
SSA (Ro) (ENA) Antibody, IgG: <1 AI
SSB (La) (ENA) Antibody, IgG: <1 AI
Scleroderma (Scl-70) (ENA) Antibody, IgG: <1 AI
Thyroperoxidase Ab SerPl-aCnc: 2 [IU]/mL (ref <9)

## 2023-10-18 LAB — COMPREHENSIVE METABOLIC PANEL WITH GFR
AG Ratio: 2 (calc) (ref 1.0–2.5)
ALT: 13 U/L (ref 6–29)
AST: 17 U/L (ref 10–30)
Albumin: 4.8 g/dL (ref 3.6–5.1)
Alkaline phosphatase (APISO): 58 U/L (ref 31–125)
BUN: 11 mg/dL (ref 7–25)
CO2: 27 mmol/L (ref 20–32)
Calcium: 9.9 mg/dL (ref 8.6–10.2)
Chloride: 105 mmol/L (ref 98–110)
Creat: 0.8 mg/dL (ref 0.50–0.96)
Globulin: 2.4 g/dL (ref 1.9–3.7)
Glucose, Bld: 77 mg/dL (ref 65–99)
Potassium: 4.4 mmol/L (ref 3.5–5.3)
Sodium: 140 mmol/L (ref 135–146)
Total Bilirubin: 0.6 mg/dL (ref 0.2–1.2)
Total Protein: 7.2 g/dL (ref 6.1–8.1)
eGFR: 103 mL/min/1.73m2 (ref >=60)

## 2023-10-18 LAB — IRON,TIBC AND FERRITIN PANEL
%SAT: 30 % (ref 16–45)
Ferritin: 18 ng/mL (ref 16–154)
Iron: 98 ug/dL (ref 40–190)
TIBC: 326 ug/dL (ref 250–450)

## 2023-10-18 LAB — HIV ANTIBODY (ROUTINE TESTING W REFLEX): HIV 1&2 Ab, 4th Generation: NONREACTIVE

## 2023-10-18 LAB — B12 AND FOLATE PANEL
Folate: 10.7 ng/mL
Vitamin B-12: 378 pg/mL (ref 200–1100)

## 2023-10-18 LAB — HEMOGLOBIN A1C
Hgb A1c MFr Bld: 5.3 % (ref <5.7)
Mean Plasma Glucose: 105 mg/dL
eAG (mmol/L): 5.8 mmol/L

## 2023-10-18 LAB — ANTI-NUCLEAR AB-TITER (ANA TITER)
ANA TITER: 1:160 {titer} — ABNORMAL HIGH
ANA Titer 1: 1:40 {titer} — ABNORMAL HIGH

## 2023-10-18 LAB — VITAMIN D 25 HYDROXY (VIT D DEFICIENCY, FRACTURES): Vit D, 25-Hydroxy: 44 ng/mL (ref 30–100)

## 2023-10-18 LAB — TSH: TSH: 0.69 m[IU]/L

## 2023-10-18 LAB — HEPATITIS C ANTIBODY: Hepatitis C Ab: NONREACTIVE

## 2023-11-14 ENCOUNTER — Encounter: Payer: Self-pay | Admitting: Nurse Practitioner

## 2023-11-14 ENCOUNTER — Other Ambulatory Visit: Payer: Self-pay

## 2023-11-14 ENCOUNTER — Ambulatory Visit: Admitting: Nurse Practitioner

## 2023-11-14 VITALS — BP 112/72 | HR 97 | Temp 98.0°F | Resp 16 | Ht 64.0 in | Wt 132.1 lb

## 2023-11-14 DIAGNOSIS — F419 Anxiety disorder, unspecified: Secondary | ICD-10-CM | POA: Diagnosis not present

## 2023-11-14 DIAGNOSIS — F331 Major depressive disorder, recurrent, moderate: Secondary | ICD-10-CM | POA: Diagnosis not present

## 2023-11-14 DIAGNOSIS — F902 Attention-deficit hyperactivity disorder, combined type: Secondary | ICD-10-CM | POA: Insufficient documentation

## 2023-11-14 DIAGNOSIS — E538 Deficiency of other specified B group vitamins: Secondary | ICD-10-CM

## 2023-11-14 MED ORDER — CYANOCOBALAMIN 1000 MCG/ML IJ SOLN
1000.0000 ug | Freq: Once | INTRAMUSCULAR | Status: AC
  Administered 2023-11-14: 1000 ug via INTRAMUSCULAR

## 2023-11-14 NOTE — Patient Instructions (Signed)
 Foods high in Vitamin B12 primarily come from animal products, including beef liver, clams, fish (like tuna and salmon), poultry, eggs, and dairy products (milk, yogurt, cheese). For those avoiding animal products, look for foods fortified with B12, such as nutritional yeast, some fortified cereals, and fortified plant-based milks

## 2023-11-14 NOTE — Addendum Note (Signed)
 Addended by: YVONE WARREN BROCKS on: 11/14/2023 04:15 PM   Modules accepted: Orders

## 2023-11-14 NOTE — Progress Notes (Signed)
 BP 112/72 (Cuff Size: Normal)   Pulse 97   Temp 98 F (36.7 C) (Oral)   Resp 16   Ht 5' 4 (1.626 m)   Wt 132 lb 1.6 oz (59.9 kg)   SpO2 95%   BMI 22.67 kg/m    Subjective:    Patient ID: Hannah Roman, female    DOB: 1995/12/18, 28 y.o.   MRN: [id number]  HPI: Hannah Roman is a 28 y.o. female  Chief Complaint  Patient presents with   Follow-up   Discussed the use of AI scribe software for clinical note transcription with the patient, who gave verbal consent to proceed.  History of Present Illness Hannah Roman is a 28 year old female with depression and anxiety who presents for a five-week follow-up.  Depressive and anxiety symptoms - Depressive symptoms present for at least five weeks - Currently taking fluoxetine  20 mg daily for three weeks - No side effects from fluoxetine  - Uncertain about effectiveness of fluoxetine  - PHQ-9 score improved from 17 to 10 - GAD-7 score stable at 4  Fatigue and low energy - Persistent fatigue - Has not started Vyvanse, which was prescribed by an ADHD specialist - Scheduled to receive a B12 injection today with hope for increased energy  Diagnostic evaluation for mood and personality disorders - Ongoing evaluation for possible borderline personality disorder versus bipolar disorder - Therapist and prescribing doctor have raised concerns regarding diagnosis - Undergoing further assessment with therapist to clarify diagnosis         11/14/2023    7:49 AM 10/10/2023    8:19 AM 10/10/2023    7:45 AM  Depression screen PHQ 2/9  Decreased Interest 1 3 0  Down, Depressed, Hopeless 1 1 0  PHQ - 2 Score 2 4 0  Altered sleeping 1 2   Tired, decreased energy 3 3   Change in appetite 1 2   Feeling bad or failure about yourself  0 2   Trouble concentrating 3 3   Moving slowly or fidgety/restless 0 1   Suicidal thoughts 0 0   PHQ-9 Score 10 17   Difficult doing work/chores Somewhat difficult Very difficult         11/14/2023    7:50 AM 10/10/2023    8:18 AM 10/08/2017    8:31 AM 02/28/2017    8:59 AM  GAD 7 : Generalized Anxiety Score  Nervous, Anxious, on Edge 1 0 2 3  Control/stop worrying 0 0 3 3  Worry too much - different things 1 1 3 3   Trouble relaxing 1 1 3 3   Restless 0 0 0 2  Easily annoyed or irritable 1 2 2 2   Afraid - awful might happen 0 0 1 2  Total GAD 7 Score 4 4 14 18   Anxiety Difficulty Somewhat difficult Somewhat difficult       Relevant past medical, surgical, family and social history reviewed and updated as indicated. Interim medical history since our last visit reviewed. Allergies and medications reviewed and updated.  Review of Systems  Constitutional: Negative for fever or weight change.  Respiratory: Negative for cough and shortness of breath.   Cardiovascular: Negative for chest pain or palpitations.  Gastrointestinal: Negative for abdominal pain, no bowel changes.  Musculoskeletal: Negative for gait problem or joint swelling.  Skin: Negative for rash.  Neurological: Negative for dizziness or headache.  No other specific complaints in a complete review of systems (except as listed in HPI above).  Objective:      BP 112/72 (Cuff Size: Normal)   Pulse 97   Temp 98 F (36.7 C) (Oral)   Resp 16   Ht 5' 4 (1.626 m)   Wt 132 lb 1.6 oz (59.9 kg)   SpO2 95%   BMI 22.67 kg/m    Wt Readings from Last 3 Encounters:  11/14/23 132 lb 1.6 oz (59.9 kg)  10/10/23 135 lb 12.8 oz (61.6 kg)  03/29/21 141 lb 4.8 oz (64.1 kg)    Physical Exam GENERAL: Alert, cooperative, well developed, no acute distress HEENT: Normocephalic, normal oropharynx, moist mucous membranes CHEST: Clear to auscultation bilaterally, No wheezes, rhonchi, or crackles CARDIOVASCULAR: Normal heart rate and rhythm, S1 and S2 normal without murmurs ABDOMEN: Soft, non-tender, non-distended, without organomegaly, Normal bowel sounds EXTREMITIES: No cyanosis or edema NEUROLOGICAL: Cranial nerves  grossly intact, Moves all extremities without gross motor or sensory deficit  Results for orders placed or performed in visit on 10/10/23  CBC with Differential/Platelet   Collection Time: 10/10/23  8:37 AM  Result Value Ref Range   WBC 6.2 3.8 - 10.8 Thousand/uL   RBC 4.61 3.80 - 5.10 Million/uL   Hemoglobin 15.0 11.7 - 15.5 g/dL   HCT 55.2 64.9 - 54.9 %   MCV 97.0 80.0 - 100.0 fL   MCH 32.5 27.0 - 33.0 pg   MCHC 33.6 32.0 - 36.0 g/dL   RDW 86.5 88.9 - 84.9 %   Platelets 220 140 - 400 Thousand/uL   MPV 11.6 7.5 - 12.5 fL   Neutro Abs 3,509 1,500 - 7,800 cells/uL   Absolute Lymphocytes 2,096 850 - 3,900 cells/uL   Absolute Monocytes 378 200 - 950 cells/uL   Eosinophils Absolute 167 15 - 500 cells/uL   Basophils Absolute 50 0 - 200 cells/uL   Neutrophils Relative % 56.6 %   Total Lymphocyte 33.8 %   Monocytes Relative 6.1 %   Eosinophils Relative 2.7 %   Basophils Relative 0.8 %  Comprehensive metabolic panel with GFR   Collection Time: 10/10/23  8:37 AM  Result Value Ref Range   Glucose, Bld 77 65 - 99 mg/dL   BUN 11 7 - 25 mg/dL   Creat 9.19 9.49 - 9.03 mg/dL   eGFR 896 > OR = 60 fO/fpw/8.26f7   BUN/Creatinine Ratio SEE NOTE: 6 - 22 (calc)   Sodium 140 135 - 146 mmol/L   Potassium 4.4 3.5 - 5.3 mmol/L   Chloride 105 98 - 110 mmol/L   CO2 27 20 - 32 mmol/L   Calcium 9.9 8.6 - 10.2 mg/dL   Total Protein 7.2 6.1 - 8.1 g/dL   Albumin 4.8 3.6 - 5.1 g/dL   Globulin 2.4 1.9 - 3.7 g/dL (calc)   AG Ratio 2.0 1.0 - 2.5 (calc)   Total Bilirubin 0.6 0.2 - 1.2 mg/dL   Alkaline phosphatase (APISO) 58 31 - 125 U/L   AST 17 10 - 30 U/L   ALT 13 6 - 29 U/L  Lipid panel   Collection Time: 10/10/23  8:37 AM  Result Value Ref Range   Cholesterol 191 <200 mg/dL   HDL 65 > OR = 50 mg/dL   Triglycerides 65 <849 mg/dL   LDL Cholesterol (Calc) 111 (H) mg/dL (calc)   Total CHOL/HDL Ratio 2.9 <5.0 (calc)   Non-HDL Cholesterol (Calc) 126 <130 mg/dL (calc)  TSH   Collection Time:  10/10/23  8:37 AM  Result Value Ref Range   TSH 0.69 mIU/L  Hemoglobin A1c  Collection Time: 10/10/23  8:37 AM  Result Value Ref Range   Hgb A1c MFr Bld 5.3 <5.7 %   Mean Plasma Glucose 105 mg/dL   eAG (mmol/L) 5.8 mmol/L  VITAMIN D  25 Hydroxy (Vit-D Deficiency, Fractures)   Collection Time: 10/10/23  8:37 AM  Result Value Ref Range   Vit D, 25-Hydroxy 44 30 - 100 ng/mL  B12 and Folate Panel   Collection Time: 10/10/23  8:37 AM  Result Value Ref Range   Vitamin B-12 378 200 - 1,100 pg/mL   Folate 10.7 ng/mL  Hepatitis C antibody   Collection Time: 10/10/23  8:37 AM  Result Value Ref Range   Hepatitis C Ab NON-REACTIVE NON-REACTIVE  HIV Antibody (routine testing w rflx)   Collection Time: 10/10/23  8:37 AM  Result Value Ref Range   HIV FINAL INTERPRETATION     HIV 1&2 Ab, 4th Generation NON-REACTIVE NON-REACTIVE  Iron, TIBC and Ferritin Panel   Collection Time: 10/10/23  8:37 AM  Result Value Ref Range   Iron 98 40 - 190 mcg/dL   TIBC 673 749 - 549 mcg/dL (calc)   %SAT 30 16 - 45 % (calc)   Ferritin 18 16 - 154 ng/mL  Analyzer ANA IFA w/RFLX Titer/Pattern,Systemic Autoimmune Panel 1   Collection Time: 10/10/23  8:37 AM  Result Value Ref Range   Anti Nuclear Antibody (ANA) POSITIVE (A) NEGATIVE   DNA Ab (DS) Crithidia, IFA NEGATIVE NEGATIVE   Chromatin (Nucleosomal) Antibody <1.0 NEG <1.0 NEGATIVE AI   ENA SM Ab Ser-aCnc <1.0 NEG <1.0 NEGATIVE AI   SM/RNP <1.0 NEG <1.0 NEGATIVE AI   Ribonucleic Protein(ENA) Antibody, IgG <1.0 NEG <1.0 NEGATIVE AI   SSA (Ro) (ENA) Antibody, IgG <1.0 NEG <1.0 NEGATIVE AI   SSB (La) (ENA) Antibody, IgG <1.0 NEG <1.0 NEGATIVE AI   Scleroderma (Scl-70) (ENA) Antibody, IgG <1.0 NEG <1.0 NEGATIVE AI   Jo-1 Autoabs <1.0 NEG <1.0 NEGATIVE AI   Centromere Ab Screen 3.0 POS (A) <1.0 NEGATIVE AI   C3 Complement 91 83 - 193 mg/dL   C4 Complement 16 15 - 57 mg/dL   Anticardiolipin IgA <7.9 APL-U/mL   Anticardiolipin IgG <2.0 GPL-U/mL    Anticardiolipin IgM <2.0 MPL-U/mL   Beta-2 Glyco 1 IgA <2.0 U/mL   Beta-2 Glyco I IgG <2.0 U/mL   Beta-2 Glyco 1 IgM <2.0 U/mL   Rheumatoid Factor (IgA) <5 U   Rheumatoid Factor (IgG) <5 U   Rheumatoid Factor (IgM) 5 U   Cyclic Citrullin Peptide Ab <83 Units   MUTATED CITRULLINATED VIMENTIN (MCV) AB <20 <20 U/mL   Thyroperoxidase Ab SerPl-aCnc 2 <9 IU/mL  Anti-nuclear ab-titer (ANA titer)   Collection Time: 10/10/23  8:37 AM  Result Value Ref Range   ANA Titer 1 1:40 (H) titer   ANA Pattern 1 NUCLEAR, SPECKLED    ANA TITER 1:160 (H) titer   ANA PATTERN NUCLEAR, CENTROMERE           Assessment & Plan:   Problem List Items Addressed This Visit       Other   Anxiety   Moderate episode of recurrent major depressive disorder (HCC) - Primary   Attention deficit hyperactivity disorder (ADHD), combined type     Assessment and Plan Assessment & Plan Major depressive disorder and anxiety disorder Currently on fluoxetine  20 mg daily. PHQ-9 score improved from 17 to 10, indicating improvement in depression symptoms. GAD score remains at 4, indicating well-managed anxiety. Reports no side effects from fluoxetine   but is unsure of its effectiveness as she has only been three weeks since starting. Full effect may take up to six weeks. - Continue fluoxetine  20 mg daily - Reassess in four weeks to determine if dosage adjustment is needed  Vitamin B12 deficiency Scheduled to receive a B12 injection today. Concerned about potential side effects, which are minimal, with possible arm soreness being the most common. Plan to administer B12 injections until levels are closer to normal, then switch to over-the-counter supplements. Dietary sources of B12 recommended as they may be better absorbed than supplements. - Administer B12 injection - Recheck B12 levels in the future to assess the need for continued injections - Provide information on dietary sources of B12  Fatigue/ADHD Reports  persistent fatigue. Prescribed Vyvanse by an ADHD specialist but has not started it yet. Cautious about starting Vyvanse due to concerns about potential bipolar disorder, as advised by her therapist. Undergoing further evaluation with her therapist to clarify diagnosis. Discussed possibility of adding medications like Vraylar, Rexulti, or Abilify if bipolar disorder is confirmed. - Start Vyvanse as prescribed by the ADHD specialist - Send MyChart message with results of the therapist's evaluation - Consider adding Vraylar, Rexulti, or Abilify if bipolar disorder is confirmed        Follow up plan: Return in about 4 weeks (around 12/12/2023) for follow up.

## 2023-11-19 ENCOUNTER — Encounter: Payer: Self-pay | Admitting: Nurse Practitioner

## 2023-12-10 ENCOUNTER — Encounter: Payer: Self-pay | Admitting: Nurse Practitioner

## 2023-12-10 ENCOUNTER — Ambulatory Visit: Admitting: Nurse Practitioner

## 2023-12-10 ENCOUNTER — Other Ambulatory Visit: Payer: Self-pay

## 2023-12-10 DIAGNOSIS — F419 Anxiety disorder, unspecified: Secondary | ICD-10-CM

## 2023-12-10 DIAGNOSIS — F331 Major depressive disorder, recurrent, moderate: Secondary | ICD-10-CM | POA: Diagnosis not present

## 2023-12-10 MED ORDER — FLUOXETINE HCL 20 MG PO CAPS: 20.0000 mg | ORAL_CAPSULE | Freq: Every day | ORAL | 1 refills | Status: DC

## 2023-12-10 NOTE — Progress Notes (Signed)
 BP 118/72 (Cuff Size: Large)   Pulse 96   Temp 97.9 F (36.6 C) (Oral)   Resp 16   Ht 5' 4 (1.626 m)   Wt 129 lb 9.6 oz (58.8 kg)   SpO2 98%   BMI 22.25 kg/m    Subjective:    Patient ID: Hannah Roman, female    DOB: [birth year], 28 y.o.   MRN: [id number]  HPI: Genine Beckett is a 28 y.o. female  Chief Complaint  Patient presents with   Depression    4 week recheck   Discussed the use of AI scribe software for clinical note transcription with the patient, who gave verbal consent to proceed.  History of Present Illness Honestie Kulik is a 28 year old female who presents for a four-week follow-up for depression, anxiety, and ADHD.  Depressive and anxiety symptoms - Significant improvement in mood and anxiety symptoms over the past four weeks - PHQ-9 score decreased from 10 to 0 - GAD-7 score decreased from 4 to 0 - No new symptoms or side effects since starting current treatment regimen - Currently taking fluoxetine  20 mg daily  Attention deficit hyperactivity disorder symptoms - Currently taking Vyvanse as prescribed by a specialist - No side effects from ADHD medication  Psychotherapy and personality disorder evaluation - Currently engaged in therapy - Therapist suspects possible borderline personality disorder         12/10/2023    7:52 AM 11/14/2023    7:49 AM 10/10/2023    8:19 AM  Depression screen PHQ 2/9  Decreased Interest 0 1 3  Down, Depressed, Hopeless 0 1 1  PHQ - 2 Score 0 2 4  Altered sleeping 0 1 2  Tired, decreased energy 0 3 3  Change in appetite 0 1 2  Feeling bad or failure about yourself  0 0 2  Trouble concentrating 0 3 3  Moving slowly or fidgety/restless 0 0 1  Suicidal thoughts 0 0 0  PHQ-9 Score 0 10 17  Difficult doing work/chores Not difficult at all Somewhat difficult Very difficult       12/10/2023    7:53 AM 11/14/2023    7:50 AM 10/10/2023    8:18 AM 10/08/2017    8:31 AM  GAD 7 : Generalized Anxiety Score   Nervous, Anxious, on Edge 0 1 0 2  Control/stop worrying 0 0 0 3  Worry too much - different things 0 1 1 3   Trouble relaxing 0 1 1 3   Restless 0 0 0 0  Easily annoyed or irritable 0 1 2 2   Afraid - awful might happen 0 0 0 1  Total GAD 7 Score 0 4 4 14   Anxiety Difficulty Not difficult at all Somewhat difficult Somewhat difficult      Relevant past medical, surgical, family and social history reviewed and updated as indicated. Interim medical history since our last visit reviewed. Allergies and medications reviewed and updated.  Review of Systems  Constitutional: Negative for fever or weight change.  Respiratory: Negative for cough and shortness of breath.   Cardiovascular: Negative for chest pain or palpitations.  Gastrointestinal: Negative for abdominal pain, no bowel changes.  Musculoskeletal: Negative for gait problem or joint swelling.  Skin: Negative for rash.  Neurological: Negative for dizziness or headache.  No other specific complaints in a complete review of systems (except as listed in HPI above).      Objective:      BP 118/72 (Cuff Size: Large)   Pulse  96   Temp 97.9 F (36.6 C) (Oral)   Resp 16   Ht 5' 4 (1.626 m)   Wt 129 lb 9.6 oz (58.8 kg)   SpO2 98%   BMI 22.25 kg/m    Wt Readings from Last 3 Encounters:  12/10/23 129 lb 9.6 oz (58.8 kg)  11/14/23 132 lb 1.6 oz (59.9 kg)  10/10/23 135 lb 12.8 oz (61.6 kg)    Physical Exam GENERAL: Alert, cooperative, well developed, no acute distress HEENT: Normocephalic, normal oropharynx, moist mucous membranes CHEST: Clear to auscultation bilaterally, No wheezes, rhonchi, or crackles CARDIOVASCULAR: Normal heart rate and rhythm, S1 and S2 normal without murmurs ABDOMEN: Soft, non-tender, non-distended, without organomegaly, Normal bowel sounds EXTREMITIES: No cyanosis or edema NEUROLOGICAL: Cranial nerves grossly intact, Moves all extremities without gross motor or sensory deficit  Results for orders  placed or performed in visit on 10/10/23  CBC with Differential/Platelet   Collection Time: 10/10/23  8:37 AM  Result Value Ref Range   WBC 6.2 3.8 - 10.8 Thousand/uL   RBC 4.61 3.80 - 5.10 Million/uL   Hemoglobin 15.0 11.7 - 15.5 g/dL   HCT 55.2 64.9 - 54.9 %   MCV 97.0 80.0 - 100.0 fL   MCH 32.5 27.0 - 33.0 pg   MCHC 33.6 32.0 - 36.0 g/dL   RDW 86.5 88.9 - 84.9 %   Platelets 220 140 - 400 Thousand/uL   MPV 11.6 7.5 - 12.5 fL   Neutro Abs 3,509 1,500 - 7,800 cells/uL   Absolute Lymphocytes 2,096 850 - 3,900 cells/uL   Absolute Monocytes 378 200 - 950 cells/uL   Eosinophils Absolute 167 15 - 500 cells/uL   Basophils Absolute 50 0 - 200 cells/uL   Neutrophils Relative % 56.6 %   Total Lymphocyte 33.8 %   Monocytes Relative 6.1 %   Eosinophils Relative 2.7 %   Basophils Relative 0.8 %  Comprehensive metabolic panel with GFR   Collection Time: 10/10/23  8:37 AM  Result Value Ref Range   Glucose, Bld 77 65 - 99 mg/dL   BUN 11 7 - 25 mg/dL   Creat 9.19 9.49 - 9.03 mg/dL   eGFR 896 > OR = 60 fO/fpw/8.26f7   BUN/Creatinine Ratio SEE NOTE: 6 - 22 (calc)   Sodium 140 135 - 146 mmol/L   Potassium 4.4 3.5 - 5.3 mmol/L   Chloride 105 98 - 110 mmol/L   CO2 27 20 - 32 mmol/L   Calcium 9.9 8.6 - 10.2 mg/dL   Total Protein 7.2 6.1 - 8.1 g/dL   Albumin 4.8 3.6 - 5.1 g/dL   Globulin 2.4 1.9 - 3.7 g/dL (calc)   AG Ratio 2.0 1.0 - 2.5 (calc)   Total Bilirubin 0.6 0.2 - 1.2 mg/dL   Alkaline phosphatase (APISO) 58 31 - 125 U/L   AST 17 10 - 30 U/L   ALT 13 6 - 29 U/L  Lipid panel   Collection Time: 10/10/23  8:37 AM  Result Value Ref Range   Cholesterol 191 <200 mg/dL   HDL 65 > OR = 50 mg/dL   Triglycerides 65 <849 mg/dL   LDL Cholesterol (Calc) 111 (H) mg/dL (calc)   Total CHOL/HDL Ratio 2.9 <5.0 (calc)   Non-HDL Cholesterol (Calc) 126 <130 mg/dL (calc)  TSH   Collection Time: 10/10/23  8:37 AM  Result Value Ref Range   TSH 0.69 mIU/L  Hemoglobin A1c   Collection Time: 10/10/23   8:37 AM  Result Value Ref Range  Hgb A1c MFr Bld 5.3 <5.7 %   Mean Plasma Glucose 105 mg/dL   eAG (mmol/L) 5.8 mmol/L  VITAMIN D  25 Hydroxy (Vit-D Deficiency, Fractures)   Collection Time: 10/10/23  8:37 AM  Result Value Ref Range   Vit D, 25-Hydroxy 44 30 - 100 ng/mL  B12 and Folate Panel   Collection Time: 10/10/23  8:37 AM  Result Value Ref Range   Vitamin B-12 378 200 - 1,100 pg/mL   Folate 10.7 ng/mL  Hepatitis C antibody   Collection Time: 10/10/23  8:37 AM  Result Value Ref Range   Hepatitis C Ab NON-REACTIVE NON-REACTIVE  HIV Antibody (routine testing w rflx)   Collection Time: 10/10/23  8:37 AM  Result Value Ref Range   HIV FINAL INTERPRETATION     HIV 1&2 Ab, 4th Generation NON-REACTIVE NON-REACTIVE  Iron, TIBC and Ferritin Panel   Collection Time: 10/10/23  8:37 AM  Result Value Ref Range   Iron 98 40 - 190 mcg/dL   TIBC 673 749 - 549 mcg/dL (calc)   %SAT 30 16 - 45 % (calc)   Ferritin 18 16 - 154 ng/mL  Analyzer ANA IFA w/RFLX Titer/Pattern,Systemic Autoimmune Panel 1   Collection Time: 10/10/23  8:37 AM  Result Value Ref Range   Anti Nuclear Antibody (ANA) POSITIVE (A) NEGATIVE   DNA Ab (DS) Crithidia, IFA NEGATIVE NEGATIVE   Chromatin (Nucleosomal) Antibody <1.0 NEG <1.0 NEGATIVE AI   ENA SM Ab Ser-aCnc <1.0 NEG <1.0 NEGATIVE AI   SM/RNP <1.0 NEG <1.0 NEGATIVE AI   Ribonucleic Protein(ENA) Antibody, IgG <1.0 NEG <1.0 NEGATIVE AI   SSA (Ro) (ENA) Antibody, IgG <1.0 NEG <1.0 NEGATIVE AI   SSB (La) (ENA) Antibody, IgG <1.0 NEG <1.0 NEGATIVE AI   Scleroderma (Scl-70) (ENA) Antibody, IgG <1.0 NEG <1.0 NEGATIVE AI   Jo-1 Autoabs <1.0 NEG <1.0 NEGATIVE AI   Centromere Ab Screen 3.0 POS (A) <1.0 NEGATIVE AI   C3 Complement 91 83 - 193 mg/dL   C4 Complement 16 15 - 57 mg/dL   Anticardiolipin IgA <7.9 APL-U/mL   Anticardiolipin IgG <2.0 GPL-U/mL   Anticardiolipin IgM <2.0 MPL-U/mL   Beta-2 Glyco 1 IgA <2.0 U/mL   Beta-2 Glyco I IgG <2.0 U/mL   Beta-2 Glyco 1  IgM <2.0 U/mL   Rheumatoid Factor (IgA) <5 U   Rheumatoid Factor (IgG) <5 U   Rheumatoid Factor (IgM) 5 U   Cyclic Citrullin Peptide Ab <83 Units   MUTATED CITRULLINATED VIMENTIN (MCV) AB <20 <20 U/mL   Thyroperoxidase Ab SerPl-aCnc 2 <9 IU/mL  Anti-nuclear ab-titer (ANA titer)   Collection Time: 10/10/23  8:37 AM  Result Value Ref Range   ANA Titer 1 1:40 (H) titer   ANA Pattern 1 NUCLEAR, SPECKLED    ANA TITER 1:160 (H) titer   ANA PATTERN NUCLEAR, CENTROMERE           Assessment & Plan:   Problem List Items Addressed This Visit       Other   Anxiety   Relevant Medications   FLUoxetine  (PROZAC ) 20 MG capsule   Moderate episode of recurrent major depressive disorder (HCC)   Relevant Medications   FLUoxetine  (PROZAC ) 20 MG capsule     Assessment and Plan Assessment & Plan Depression and Anxiety Disorders Depression and anxiety have significantly improved with current treatment. PHQ-9 score decreased from 10 to 0, and GAD score decreased from 4 to 0. Improvement may be attributed to the combination of fluoxetine  and Vyvanse. -  Continue fluoxetine  20 mg daily. - Scheduled follow-up in six months unless symptoms change.  Attention-Deficit Hyperactivity Disorder (ADHD) ADHD is managed with Vyvanse prescribed by an ADHD specialist. No side effects reported from Vyvanse. Improvement in depression and anxiety may be related to ADHD treatment. - Continue Vyvanse as prescribed by ADHD specialist.        Follow up plan: Return in about 6 months (around 06/09/2024) for follow up.

## 2023-12-13 ENCOUNTER — Ambulatory Visit

## 2023-12-13 VITALS — BP 118/80 | HR 112 | Temp 98.4°F | Ht 63.75 in | Wt 128.8 lb

## 2023-12-13 DIAGNOSIS — M797 Fibromyalgia: Secondary | ICD-10-CM

## 2023-12-13 DIAGNOSIS — L659 Nonscarring hair loss, unspecified: Secondary | ICD-10-CM

## 2023-12-13 DIAGNOSIS — R531 Weakness: Secondary | ICD-10-CM | POA: Diagnosis not present

## 2023-12-13 DIAGNOSIS — M359 Systemic involvement of connective tissue, unspecified: Secondary | ICD-10-CM

## 2023-12-13 DIAGNOSIS — M791 Myalgia, unspecified site: Secondary | ICD-10-CM

## 2023-12-13 DIAGNOSIS — R7689 Other specified abnormal immunological findings in serum: Secondary | ICD-10-CM

## 2023-12-13 NOTE — Patient Instructions (Signed)
 Explore the [employer]  fibroguide:  [email or web address]

## 2023-12-13 NOTE — Progress Notes (Addendum)
 Office Visit Note  Patient: Hannah Roman             Date of Birth: August 21, 1995           MRN: [id number]             PCP: Gareth Mliss FALCON, FNP Referring: Gareth Mliss FALCON, FNP Visit Date: 12/13/2023 Occupation: Data Unavailable  Subjective:  New Patient (Initial Visit) (Positive ANA, Muscle tenderness, weakness and pain)   History of Present Illness Hannah Roman is a 28 year old female who presents with fatigue, brain fog, and hair loss. She was referred by Dr. PCP for evaluation of abnormal blood test results and persistent fatigue.  Since June, she has experienced significant fatigue, brain fog, and hair loss, which has been progressively worsening over time. She feels 'really off' and has difficulty performing daily activities. She denies recent illnesses at the onset of symptoms. She experiences night sweats without fevers, unexplained weight loss, and decreased appetite, often forgetting to eat. Shortness of breath with exertion has worsened since the summer.  She had a severe reaction to a B12 injection last month, feeling more fatigued and unwell for several days. She has been prescribed oral B12 but has not started it. She had a recent low B12 result and received a B12 injection, after which she felt worse. She recently began Vyvanse for brain fog.  She experiences dry mouth but no difficulty swallowing, dry eyes, or oral sores. Patchy hair loss, particularly on the top of her head, has intensified since May or June, initially attributed to postpartum changes. She has a rash on her cheeks that becomes red but no new rashes elsewhere. She reports sun sensitivity without prolonged rashes from sun exposure.  She experiences color changes in her hands and feet in the cold, turning purple, and sometimes white and red. She reports swollen glands that are painful to touch and occur randomly. Muscle aches, particularly in her legs, feel sore and crampy, making it difficult to climb  stairs. No joint pain, but she reports weakness, especially when lifting objects, and occasional bruising on her legs without known cause.  There is no known family history of autoimmune diseases, but she is unsure of the accuracy of her family history. She has a two and a 72 year old daughter and reports no special diet or recent lifestyle changes.     Activities of Daily Living:  Patient reports morning stiffness for 0 minutes.   Patient Reports nocturnal pain.  Difficulty dressing/grooming: Denies Difficulty climbing stairs: Reports Difficulty getting out of chair: Denies Difficulty using hands for taps, buttons, cutlery, and/or writing: Denies  Review of Systems  Constitutional:  Positive for fatigue.  HENT:  Positive for mouth dryness. Negative for mouth sores.   Eyes:  Negative for dryness.  Respiratory:  Negative for shortness of breath.   Cardiovascular:  Negative for chest pain and palpitations.  Gastrointestinal:  Negative for blood in stool, constipation and diarrhea.  Endocrine: Negative for increased urination.  Genitourinary:  Negative for involuntary urination.  Musculoskeletal:  Positive for gait problem, myalgias, muscle weakness, muscle tenderness and myalgias. Negative for joint pain, joint pain, joint swelling and morning stiffness.  Skin:  Positive for color change and hair loss. Negative for rash.  Allergic/Immunologic: Negative for susceptible to infections.  Neurological:  Positive for dizziness and headaches.  Hematological:  Positive for swollen glands.  Psychiatric/Behavioral:  Positive for depressed mood. Negative for sleep disturbance. The patient is nervous/anxious.  PMFS History:  Patient Active Problem List   Diagnosis Date Noted   Attention deficit hyperactivity disorder (ADHD), combined type 11/14/2023   Moderate episode of recurrent major depressive disorder (HCC) 10/10/2023   Anxiety     Past Medical History:  Diagnosis Date   ADD  (attention deficit disorder)    Allergy    Penicillin   Anxiety    Depression    History of chicken pox     Family History  Problem Relation Age of Onset   Depression Father    High Cholesterol Father    Hypertension Father    Cancer Maternal Grandmother    Depression Maternal Grandmother    Heart attack Maternal Grandfather    Breast cancer Paternal Grandmother    Lung cancer Paternal Grandfather    Brain cancer Paternal Aunt 52   Colon cancer Neg Hx    Past Surgical History:  Procedure Laterality Date   KIDNEY SURGERY Left 2010   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, had reflux   Social History   Tobacco Use   Smoking status: Never    Passive exposure: Never   Smokeless tobacco: Never  Vaping Use   Vaping status: Never Used  Substance Use Topics   Alcohol use: Yes    Comment: socially   Drug use: No   Social History   Social History Narrative   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[employer]   Has fiance --> getting married in July 2020     Immunization History  Administered Date(s) Administered   DTaP 09/05/1995, 11/07/1995, 01/09/1996, 07/09/1996, 08/03/1999   HIB (PRP-OMP) 09/05/1995, 11/07/1995, 01/09/1996, 07/09/1996   HPV Quadrivalent 10/20/2006, 11/12/2006, 06/27/2007   Hepatitis A 08/20/2006, 06/27/2007   Hepatitis A, Ped/Adol-2 Dose 08/20/2006, 06/27/2007   Hepatitis B 05/15/1995, 08/08/1995, 01/09/1996   IPV 09/08/1995, 11/07/1995, 07/09/1996, 08/03/1999   Influenza Inj Mdck Quad Pf 02/26/2020   Influenza-Unspecified 11/30/2020   MMR 07/09/1996, 08/03/1999   Meningococcal Conjugate 06/27/2007, 09/12/2011   PFIZER(Purple Top)SARS-COV-2 Vaccination 05/18/2019, 06/08/2019   Tdap 08/25/2008, 12/30/2020   Varicella 07/09/1996, 11/12/2006     Objective: Vital Signs: BP 118/80 (BP Location: Right Arm, Patient Position: Sitting, Cuff Size: Small)   Pulse (!) 112   Temp 98.4 F (36.9 C)   Ht 5' 3.75 (1.619 m)   Wt 128 lb 12.8 oz (58.4 kg)   LMP 11/17/2023   BMI 22.28 kg/m     Physical Exam Vitals and nursing note reviewed.  HENT:     Head: Normocephalic and atraumatic.     Comments: Patchy hair loss present left frontal scalp; new fine hairs present.    Nose: Nose normal.  Eyes:     Conjunctiva/sclera: Conjunctivae normal.     Pupils: Pupils are equal, round, and reactive to light.  Cardiovascular:     Rate and Rhythm: Normal rate and regular rhythm.     Heart sounds: Normal heart sounds.  Pulmonary:     Effort: Pulmonary effort is normal.     Breath sounds: Normal breath sounds.  Musculoskeletal:     Comments: Diffuse tenderness to palpation muscles and joints UE and LE; no synovitis  Skin:    General: Skin is warm and dry.  Neurological:     Mental Status: She is alert. Mental status is at baseline.     Motor: Weakness (4/5 b/l proximal UE and LE; unclear effort limitations) present.  Psychiatric:        Mood and Affect: Mood normal.        Behavior: Behavior  normal.      Investigation: No additional findings.  Imaging: No results found.  Recent Labs: Lab Results  Component Value Date   WBC 2.9 (L) 12/13/2023   HGB 15.3 12/13/2023   PLT 155 12/13/2023   NA 140 10/10/2023   K 4.4 10/10/2023   CL 105 10/10/2023   CO2 27 10/10/2023   GLUCOSE 77 10/10/2023   BUN 11 10/10/2023   CREATININE 0.78 12/13/2023   BILITOT 0.6 10/10/2023   ALKPHOS 58 10/08/2017   AST 18 12/13/2023   ALT 14 12/13/2023   PROT 7.2 10/10/2023   ALBUMIN 4.3 10/08/2017   CALCIUM 9.9 10/10/2023   GFRAA >60 10/26/2016    Speciality Comments: No specialty comments available.  Procedures:  No procedures performed Allergies: Penicillins and Lexapro  [escitalopram  oxalate]   Assessment / Plan:     Visit Diagnoses:  Positive ANA (antinuclear antibody)  Positive anti-centromere abs Undifferentiated connective tissue disease? Patient with positive ANA 1:160 and mildly positive centromere abs. Patient does have evidence of alopecia concerning for a possible  inflammatory process. However, as discussed with patient, a majority of her presentation is not consistent with an underlying rheumatologic autoimmune disease. However, given some of her manifestations, will obtain UPC and repeat centromere abs given mild positive on prior work-up.    She has no other features of SLE (no objective malar rash, inflammatory arthritis, Raynaud's, alopecia, recurrent cytopenias), Sjogren's disease (no sicca symptoms), scleroderma (no sclerodactyly, no Raynaud's).  Discussed with patient that a positive ANA need not represent presence of clinically active systemic autoimmune disease.  Can be positive in the normal population, autoimmune thyroid  disease (Graves' disease, Hashimoto's thyroiditis etc.), or infection. ANA can be positive in the healthy population at the following rates: ANA 1:40: 20%-30%, ANA 1:80: 10%-15%, ANA 1:160: 5%, ANA 1:320: 3% positive, healthy relative of an SLE patient: 5%-25% positive (usually low titers), and elderly (age >70 years): up to 70% positive at ANA titer 1:40.   Given acute nature of symptoms, will rule out infectious/viral etiology with parvovirus and EBV as well.   Will check baseline toxicity labs today as well in the event that decision is made to pursue UCTD tx w/ HCQ.   Fibromyalgia The patient notes constant fatigue and widespread pain, trouble concentrating (when reading a newspaper or watching TV), memory deficits with mental fog, and nonrejuvenating sleep. The patient has a history of widespread pain lasting more than 3 months. She does have some evidence of muscle weakness on exam, but unclear if limited by effort or pain. Will check CK and aldolase today. However, more suspicion for an underling non-inflammatory process as the cause of the aforementioned symptoms.   Discussed with patient that fibromyalgia is a chronic pain disorder, thought to be a disorder of pain regulation, and often classified as a form of central  sensitization. Fibromyalgia is characterized by widespread musculoskeletal pain, accompanied by fatigue, cognitive disturbances, psychiatric symptoms, headaches, paresthesia's, and multiple somatic symptoms. Fibromyalgia is not an inflammatory or autoimmune condition, and immunosuppressive therapy does not treat fibromyalgia. Treatment is both nonpharmacological and pharmacological in nature, and should focus on sleep hygiene, graded exercise programs, pharmacologic therapy (options including TCA (amitriptyline), SNRIs (duloxetine, milnacipran), flexeril, gabapentin/lyrica, naltrexone, TENS), pyschosocial therapy i.e. CBT. The patient will likely benefit from multidisciplinary care by PCP, PT, integrative medicine, psychiatry, and pain management. These recommendations were discussed with the patient and will be sent to primary care provider.    Orders: Orders Placed This Encounter  Procedures  CK   Aldolase   CBC with Differential/Platelet   AST   ALT   Parvovirus B19 antibody, IgG and IgM   Epstein-Barr virus VCA antibody panel   Centromere Antibodies   Protein / creatinine ratio, urine   Creatinine, serum   No orders of the defined types were placed in this encounter.  I personally spent a total of 60 minutes in the care of the patient today including preparing to see the patient, getting/reviewing separately obtained history, performing a medically appropriate exam/evaluation, counseling and educating, placing orders, and documenting clinical information in the EHR.   Follow-Up Instructions: Return in about 1 month (around 01/13/2024).   Asberry Claw, DO

## 2023-12-17 ENCOUNTER — Telehealth: Payer: Self-pay | Admitting: *Deleted

## 2023-12-17 LAB — PARVOVIRUS B19 ANTIBODY, IGG AND IGM
Parvovirus B19 IgG: 7.1 — ABNORMAL HIGH (ref <0.9)
Parvovirus B19 IgM: 0.2 (ref <0.9)

## 2023-12-17 LAB — CBC WITH DIFFERENTIAL/PLATELET
Absolute Lymphocytes: 792 {cells}/uL — ABNORMAL LOW (ref 850–3900)
Absolute Monocytes: 302 {cells}/uL (ref 200–950)
Basophils Absolute: 20 {cells}/uL (ref 0–200)
Basophils Relative: 0.7 %
Eosinophils Absolute: 20 {cells}/uL (ref 15–500)
Eosinophils Relative: 0.7 %
HCT: 45.1 % — ABNORMAL HIGH (ref 35.0–45.0)
Hemoglobin: 15.3 g/dL (ref 11.7–15.5)
MCH: 31.8 pg (ref 27.0–33.0)
MCHC: 33.9 g/dL (ref 32.0–36.0)
MCV: 93.8 fL (ref 80.0–100.0)
MPV: 11.8 fL (ref 7.5–12.5)
Monocytes Relative: 10.4 %
Neutro Abs: 1766 {cells}/uL (ref 1500–7800)
Neutrophils Relative %: 60.9 %
Platelets: 155 Thousand/uL (ref 140–400)
RBC: 4.81 Million/uL (ref 3.80–5.10)
RDW: 12.6 % (ref 11.0–15.0)
Total Lymphocyte: 27.3 %
WBC: 2.9 Thousand/uL — ABNORMAL LOW (ref 3.8–10.8)

## 2023-12-17 LAB — PROTEIN / CREATININE RATIO, URINE
Creatinine, Urine: 239 mg/dL (ref 20–275)
Protein/Creat Ratio: 67 mg/g{creat} (ref 24–184)
Protein/Creatinine Ratio: 0.067 mg/mg{creat} (ref 0.024–0.184)
Total Protein, Urine: 16 mg/dL (ref 5–24)

## 2023-12-17 LAB — AST: AST: 18 U/L (ref 10–30)

## 2023-12-17 LAB — EPSTEIN-BARR VIRUS VCA ANTIBODY PANEL
EBV NA IgG: 161 U/mL — ABNORMAL HIGH
EBV VCA IgG: 54.8 U/mL — ABNORMAL HIGH
EBV VCA IgM: <36 U/mL

## 2023-12-17 LAB — CENTROMERE ANTIBODIES: Centromere Ab Screen: 2.9 AI — AB

## 2023-12-17 LAB — ALT: ALT: 14 U/L (ref 6–29)

## 2023-12-17 LAB — ALDOLASE: Aldolase: 4.6 U/L (ref <=8.1)

## 2023-12-17 LAB — CK: Total CK: 63 U/L (ref 20–239)

## 2023-12-17 LAB — CREATININE, SERUM: Creat: 0.78 mg/dL (ref 0.50–0.96)

## 2023-12-17 NOTE — Telephone Encounter (Signed)
 Patient returned call to the office and has been scheduled for 12/25/2023.

## 2023-12-17 NOTE — Telephone Encounter (Signed)
-----   Message from Asberry Claw sent at 12/17/2023  8:06 AM EDT ----- Can we call patient and see if we can book this patient for a sooner follow-up appt to discuss test results?

## 2023-12-17 NOTE — Telephone Encounter (Signed)
 Attempted to contact the patient and left message for patient to call the office.  Plan to offer patient an appointment on 12/25/2023 at 11:00 am.

## 2023-12-25 ENCOUNTER — Ambulatory Visit

## 2023-12-25 VITALS — BP 100/72 | HR 85 | Temp 98.3°F | Resp 12 | Ht 64.0 in | Wt 130.4 lb

## 2023-12-25 DIAGNOSIS — M797 Fibromyalgia: Secondary | ICD-10-CM

## 2023-12-25 DIAGNOSIS — R7689 Other specified abnormal immunological findings in serum: Secondary | ICD-10-CM | POA: Diagnosis not present

## 2023-12-25 DIAGNOSIS — M359 Systemic involvement of connective tissue, unspecified: Secondary | ICD-10-CM | POA: Diagnosis not present

## 2023-12-25 MED ORDER — HYDROXYCHLOROQUINE SULFATE 200 MG PO TABS: 200.0000 mg | ORAL_TABLET | Freq: Every day | ORAL | 0 refills | Status: AC

## 2023-12-25 NOTE — Progress Notes (Signed)
 Office Visit Note  Patient: Hannah Roman             Date of Birth: [birth year]           MRN: [id number]             PCP: Gareth Mliss FALCON, FNP Referring: Gareth Mliss FALCON, FNP Visit Date: 12/25/2023 Occupation: Data Unavailable  Subjective:  Results  Discussed the use of AI scribe software for clinical note transcription with the patient, who gave verbal consent to proceed.  History of Present Illness Evelynne Spiers is a 28 year old female who presents for follow-up of her symptoms and positive antibody tests. She was last seen as a new patient on 12/13/23 for positive ANA.   Discussed with patient that her repeat anti-centromere abs test was once again positive. Given this and her symptoms of hair loss, leukopenia, joint pain, and fatigue; I discussed possibility of a UCTD.  She experiences leg pain and weakness, particularly when carrying her daughter, causing her to feel wobbly and heavy. She also reports easy bruising and occasional gingival bleeding.  Her current medications include Prozac  20 mg daily, which she started in August 2025. She has recently stopped taking Vyvanse due to palpitations. She has not yet started B12 supplements but plans to do so shortly.    Activities of Daily Living:  Patient reports morning stiffness for 0 minutes.   Patient Denies nocturnal pain.  Difficulty dressing/grooming: Denies Difficulty climbing stairs: Reports Difficulty getting out of chair: Denies Difficulty using hands for taps, buttons, cutlery, and/or writing: Denies  Review of Systems  Constitutional:  Positive for fatigue.  HENT:  Negative for mouth sores and mouth dryness.   Eyes:  Negative for dryness.  Respiratory:  Negative for shortness of breath.   Cardiovascular:  Negative for chest pain and palpitations.  Gastrointestinal:  Negative for blood in stool, constipation and diarrhea.  Endocrine: Negative for increased urination.  Genitourinary:  Negative for  involuntary urination.  Musculoskeletal:  Positive for myalgias, muscle weakness, muscle tenderness and myalgias. Negative for joint pain, gait problem, joint pain, joint swelling and morning stiffness.  Skin:  Positive for color change, rash, hair loss and sensitivity to sunlight.  Allergic/Immunologic: Negative for susceptible to infections.  Neurological:  Positive for dizziness and headaches.  Hematological:  Negative for swollen glands.  Psychiatric/Behavioral:  Positive for depressed mood. Negative for sleep disturbance. The patient is nervous/anxious.     PMFS History:  Patient Active Problem List   Diagnosis Date Noted  . Attention deficit hyperactivity disorder (ADHD), combined type 11/14/2023  . Moderate episode of recurrent major depressive disorder (HCC) 10/10/2023  . Anxiety     Past Medical History:  Diagnosis Date  . ADD (attention deficit disorder)   . Allergy    Penicillin  . Anxiety   . Depression   . History of chicken pox     Family History  Problem Relation Age of Onset  . Depression Father   . High Cholesterol Father   . Hypertension Father   . Cancer Maternal Grandmother   . Depression Maternal Grandmother   . Heart attack Maternal Grandfather   . Breast cancer Paternal Grandmother   . Lung cancer Paternal Grandfather   . Brain cancer Paternal Aunt 52  . Colon cancer Neg Hx    Past Surgical History:  Procedure Laterality Date  . 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  Smoking status: Never    Passive exposure: Never  . Smokeless tobacco: Never  Vaping Use  . Vaping status: Never Used  Substance Use Topics  . Alcohol use: Yes    Comment: socially  . 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 DTaP 09/05/1995, 11/07/1995, 01/09/1996,  07/09/1996, 08/03/1999  . HIB (PRP-OMP) 09/05/1995, 11/07/1995, 01/09/1996, 07/09/1996  . HPV Quadrivalent 10/20/2006, 11/12/2006, 06/27/2007  . Hepatitis A 08/20/2006, 06/27/2007  . Hepatitis A, Ped/Adol-2 Dose 08/20/2006, 06/27/2007  . Hepatitis B 1995-03-06, 08/08/1995, 01/09/1996  . IPV 09/08/1995, 11/07/1995, 07/09/1996, 08/03/1999  . Influenza Inj Mdck Quad Pf 02/26/2020  . Influenza-Unspecified 11/30/2020  . MMR 07/09/1996, 08/03/1999  . Meningococcal Conjugate 06/27/2007, 09/12/2011  . PFIZER(Purple Top)SARS-COV-2 Vaccination 05/18/2019, 06/08/2019  . Tdap 08/25/2008, 12/30/2020  . Varicella 07/09/1996, 11/12/2006     Objective: Vital Signs: BP 100/72 (BP Location: Right Arm, Patient Position: Sitting, Cuff Size: Small)   Pulse 85   Temp 98.3 F (36.8 C)   Resp 12   Ht 5' 4 (1.626 m)   Wt 130 lb 6.4 oz (59.1 kg)   LMP 12/11/2023   BMI 22.38 kg/m    Physical Exam Vitals and nursing note reviewed.  HENT:     Head: Normocephalic and atraumatic.     Comments: Alopecia frontal scalp    Nose: Nose normal.  Eyes:     Conjunctiva/sclera: Conjunctivae normal.     Pupils: Pupils are equal, round, and reactive to light.  Pulmonary:     Effort: Pulmonary effort is normal. No respiratory distress.  Skin:    General: Skin is warm and dry.  Neurological:     Mental Status: She is alert. Mental status is at baseline.  Psychiatric:        Mood and Affect: Mood normal.        Behavior: Behavior normal.      Musculoskeletal Exam:   CDAI Exam: CDAI Score: -- Patient Global: --; Provider Global: -- Swollen: 0 ; Tender: 0  Joint Exam 12/25/2023   All documented joints were normal     Investigation: No additional findings.  Imaging: No results found.  Recent Labs: Lab Results  Component Value Date   WBC 2.9 (L) 12/13/2023   HGB 15.3 12/13/2023   PLT 155 12/13/2023   NA 140 10/10/2023   K 4.4 10/10/2023   CL 105 10/10/2023   CO2 27 10/10/2023   GLUCOSE 77  10/10/2023   BUN 11 10/10/2023   CREATININE 0.78 12/13/2023   BILITOT 0.6 10/10/2023   ALKPHOS 58 10/08/2017   AST 18 12/13/2023   ALT 14 12/13/2023   PROT 7.2 10/10/2023   ALBUMIN 4.3 10/08/2017   CALCIUM 9.9 10/10/2023   GFRAA >60 10/26/2016    Speciality Comments: No specialty comments available.  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  Meds ordered this encounter  Medications  . hydroxychloroquine (PLAQUENIL) 200 MG tablet    Sig: Take 1 tablet (200 mg total) by mouth daily.    Dispense:  90 tablet    Refill:  0    I personally  spent a total of 60 minutes in the care of the patient today including preparing to see the patient, getting/reviewing separately obtained history, performing a medically appropriate exam/evaluation, counseling and educating, placing orders, documenting clinical information in the EHR, and communicating results.   Follow-Up Instructions: Return in about 4 months (around 04/23/2024).   Asberry Claw, DO  Note - This record has been created using Animal nutritionist.  Chart creation errors have been sought, but may not always  have been located. Such creation errors do not reflect on  the standard of medical care.

## 2023-12-28 ENCOUNTER — Ambulatory Visit: Admitting: Nurse Practitioner

## 2024-01-03 ENCOUNTER — Ambulatory Visit (INDEPENDENT_AMBULATORY_CARE_PROVIDER_SITE_OTHER): Admitting: Nurse Practitioner

## 2024-01-03 ENCOUNTER — Encounter: Payer: Self-pay | Admitting: Nurse Practitioner

## 2024-01-03 VITALS — BP 102/82 | HR 78 | Temp 97.7°F | Ht 64.0 in | Wt 130.0 lb

## 2024-01-03 DIAGNOSIS — F331 Major depressive disorder, recurrent, moderate: Secondary | ICD-10-CM | POA: Diagnosis not present

## 2024-01-03 DIAGNOSIS — M797 Fibromyalgia: Secondary | ICD-10-CM | POA: Insufficient documentation

## 2024-01-03 DIAGNOSIS — F419 Anxiety disorder, unspecified: Secondary | ICD-10-CM

## 2024-01-03 MED ORDER — DULOXETINE HCL 30 MG PO CPEP: 30.0000 mg | ORAL_CAPSULE | Freq: Every day | ORAL | 0 refills | Status: DC

## 2024-01-03 NOTE — Progress Notes (Signed)
 BP 102/82   Pulse 78   Temp 97.7 F (36.5 C)   Ht 5' 4 (1.626 m)   Wt 130 lb (59 kg)   LMP 12/11/2023   SpO2 99%   BMI 22.31 kg/m    Subjective:    Patient ID: Hannah Roman, female    DOB: 02/02/1996, 28 y.o.   MRN: [id number]  HPI: Hannah Roman is a 28 y.o. female  Chief Complaint  Patient presents with   Medication Management   Discussed the use of AI scribe software for clinical note transcription with the patient, who gave verbal consent to proceed.  History of Present Illness Juda Lajeunesse is a 28 year old female who presents for medication management due to a potential interaction with Plaquenil.  Medication management and drug interactions - Currently taking fluoxetine  20 mg daily for anxiety and depression - Advised to switch to duloxetine 30 mg daily due to potential interaction between fluoxetine  and hydroxychloroquine (Plaquenil) - Has not yet started vitamin B12 supplementation and is considering dietary sources instead  Neuropsychiatric symptoms - Anxiety and depression managed with fluoxetine  20 mg daily - Transition to duloxetine 30 mg daily planned due to medication interaction concerns  Musculoskeletal pain and fatigue - Chronic widespread pain consistent with fibromyalgia - Ongoing workup for undifferentiated connective tissue disease with positive antinuclear antibody (ANA)  Rheumatologic evaluation - 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 - Scheduled to follow up with rheumatology in three months         01/03/2024    8:35 AM 12/10/2023    7:52 AM 11/14/2023    7:49 AM  Depression screen PHQ 2/9  Decreased Interest 1 0 1  Down, Depressed, Hopeless 1 0 1  PHQ - 2 Score 2 0 2  Altered sleeping 2 0 1  Tired, decreased energy 2 0 3  Change in appetite 0 0 1  Feeling bad or failure about yourself  0 0 0  Trouble concentrating 2 0 3  Moving slowly or fidgety/restless 1 0 0  Suicidal  thoughts 0 0 0  PHQ-9 Score 9 0  10   Difficult doing work/chores Somewhat difficult Not difficult at all Somewhat difficult     Data saved with a previous flowsheet row definition       01/03/2024    8:35 AM 12/10/2023    7:53 AM 11/14/2023    7:50 AM 10/10/2023    8:18 AM  GAD 7 : Generalized Anxiety Score  Nervous, Anxious, on Edge 0 0 1 0  Control/stop worrying 1 0 0 0  Worry too much - different things 1 0 1 1  Trouble relaxing 1 0 1 1  Restless 1 0 0 0  Easily annoyed or irritable 2 0 1 2  Afraid - awful might happen 0 0 0 0  Total GAD 7 Score 6 0 4 4  Anxiety Difficulty Not difficult at all Not difficult at all Somewhat difficult Somewhat difficult     Relevant past medical, surgical, family and social history reviewed and updated as indicated. Interim medical history since our last visit reviewed. Allergies and medications reviewed and updated.  Review of Systems  Constitutional: Negative for fever or weight change.  Respiratory: Negative for cough and shortness of breath.   Cardiovascular: Negative for chest pain or palpitations.  Gastrointestinal: Negative for abdominal pain, no bowel changes.  Musculoskeletal: Negative for gait problem or joint swelling.  Skin: Negative for rash.  Neurological: Negative for dizziness  or headache.  No other specific complaints in a complete review of systems (except as listed in HPI above).      Objective:      BP 102/82   Pulse 78   Temp 97.7 F (36.5 C)   Ht 5' 4 (1.626 m)   Wt 130 lb (59 kg)   LMP 12/11/2023   SpO2 99%   BMI 22.31 kg/m    Wt Readings from Last 3 Encounters:  01/03/24 130 lb (59 kg)  12/25/23 130 lb 6.4 oz (59.1 kg)  12/13/23 128 lb 12.8 oz (58.4 kg)    Physical Exam GENERAL: Alert, cooperative, well developed, no acute distress HEENT: Normocephalic, normal oropharynx, moist mucous membranes CHEST: Clear to auscultation bilaterally, No wheezes, rhonchi, or crackles CARDIOVASCULAR: Normal heart  rate and rhythm, S1 and S2 normal without murmurs ABDOMEN: Soft, non-tender, non-distended, without organomegaly, Normal bowel sounds EXTREMITIES: No cyanosis or edema NEUROLOGICAL: Cranial nerves grossly intact, Moves all extremities without gross motor or sensory deficit  Results for orders placed or performed in visit on 12/13/23  CK   Collection Time: 12/13/23  8:56 AM  Result Value Ref Range   Total CK 63 20 - 239 U/L  Aldolase   Collection Time: 12/13/23  8:56 AM  Result Value Ref Range   Aldolase 4.6 < OR = 8.1 U/L  CBC with Differential/Platelet   Collection Time: 12/13/23  8:56 AM  Result Value Ref Range   WBC 2.9 (L) 3.8 - 10.8 Thousand/uL   RBC 4.81 3.80 - 5.10 Million/uL   Hemoglobin 15.3 11.7 - 15.5 g/dL   HCT 54.8 (H) 64.9 - 54.9 %   MCV 93.8 80.0 - 100.0 fL   MCH 31.8 27.0 - 33.0 pg   MCHC 33.9 32.0 - 36.0 g/dL   RDW 87.3 88.9 - 84.9 %   Platelets 155 140 - 400 Thousand/uL   MPV 11.8 7.5 - 12.5 fL   Neutro Abs 1,766 1,500 - 7,800 cells/uL   Absolute Lymphocytes 792 (L) 850 - 3,900 cells/uL   Absolute Monocytes 302 200 - 950 cells/uL   Eosinophils Absolute 20 15 - 500 cells/uL   Basophils Absolute 20 0 - 200 cells/uL   Neutrophils Relative % 60.9 %   Total Lymphocyte 27.3 %   Monocytes Relative 10.4 %   Eosinophils Relative 0.7 %   Basophils Relative 0.7 %  AST   Collection Time: 12/13/23  8:56 AM  Result Value Ref Range   AST 18 10 - 30 U/L  ALT   Collection Time: 12/13/23  8:56 AM  Result Value Ref Range   ALT 14 6 - 29 U/L  Parvovirus B19 antibody, IgG and IgM   Collection Time: 12/13/23  8:56 AM  Result Value Ref Range   Parvovirus B19 IgG 7.1 (H) <0.9   Parvovirus B19 IgM 0.2 <0.9  Epstein-Barr virus VCA antibody panel   Collection Time: 12/13/23  8:56 AM  Result Value Ref Range   EBV VCA IgM <36.00 U/mL   EBV VCA IgG 54.80 (H) U/mL   EBV NA IgG 161.00 (H) U/mL   Interpretation    Centromere Antibodies   Collection Time: 12/13/23  8:56 AM   Result Value Ref Range   Centromere Ab Screen 2.9 POS (A) <1.0 NEG AI  Protein / creatinine ratio, urine   Collection Time: 12/13/23  8:56 AM  Result Value Ref Range   Creatinine, Urine 239 20 - 275 mg/dL   Protein/Creat Ratio 67 24 - 184 mg/g  creat   Protein/Creatinine Ratio 0.067 0.024 - 0.184 mg/mg creat   Total Protein, Urine 16 5 - 24 mg/dL  Creatinine, serum   Collection Time: 12/13/23  8:56 AM  Result Value Ref Range   Creat 0.78 0.50 - 0.96 mg/dL          Assessment & Plan:   Problem List Items Addressed This Visit       Other   Anxiety   Relevant Medications   DULoxetine (CYMBALTA) 30 MG capsule   Moderate episode of recurrent major depressive disorder (HCC) - Primary   Relevant Medications   DULoxetine (CYMBALTA) 30 MG capsule   Fibromyalgia   Relevant Medications   DULoxetine (CYMBALTA) 30 MG capsule     Assessment and Plan Assessment & Plan Depression and Anxiety Disorder Currently on Prozac  20 mg daily, the lowest dose. Switching to Cymbalta due to potential QTC prolongation risk with concurrent use of Prozac  and Plaquenil. Cymbalta may also help with fibromyalgia-related nerve pain. Uncertainty about mood effects of Cymbalta, but it is in her 'green zone' based on genetic testing. - Discontinued Prozac . - Started Cymbalta 30 mg daily. - Monitor for side effects and mood changes. - Follow up in 4 weeks to assess response to Cymbalta.  Fibromyalgia Cymbalta may help with nerve pain associated with fibromyalgia. - Started Cymbalta 30 mg daily.  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 Positive ANA noted. Plaquenil to be started after discontinuation of Prozac  due to potential interaction. Plaquenil may take a long time to show effects. - Start Plaquenil after a week of discontinuing Prozac . - Monitor for side effects of Plaquenil.        Follow up plan: Return in about  4 weeks (around 01/31/2024) for follow up.

## 2024-01-09 ENCOUNTER — Ambulatory Visit: Admitting: Nurse Practitioner

## 2024-01-14 ENCOUNTER — Ambulatory Visit

## 2024-01-15 ENCOUNTER — Ambulatory Visit: Admitting: Nurse Practitioner

## 2024-01-25 ENCOUNTER — Other Ambulatory Visit: Payer: Self-pay | Admitting: Nurse Practitioner

## 2024-01-25 DIAGNOSIS — M797 Fibromyalgia: Secondary | ICD-10-CM

## 2024-01-25 DIAGNOSIS — F331 Major depressive disorder, recurrent, moderate: Secondary | ICD-10-CM

## 2024-01-25 DIAGNOSIS — F419 Anxiety disorder, unspecified: Secondary | ICD-10-CM

## 2024-01-28 NOTE — Telephone Encounter (Signed)
 Requested medications are due for refill today.  yes  Requested medications are on the active medications list.  yes  Last refill. 01/03/2024 #30 0 rf  Future visit scheduled.   yes  Notes to clinic.  New medication to this pt.    Requested Prescriptions  Pending Prescriptions Disp Refills   DULoxetine  (CYMBALTA ) 30 MG capsule [Pharmacy Med Name: DULOXETINE  HCL DR 30 MG CAP] 90 capsule 1    Sig: TAKE 1 CAPSULE BY MOUTH EVERY DAY     Psychiatry: Antidepressants - SNRI - duloxetine  Passed - 01/28/2024  4:13 PM      Passed - Cr in normal range and within 360 days    Creat  Date Value Ref Range Status  12/13/2023 0.78 0.50 - 0.96 mg/dL Final   Creatinine, Urine  Date Value Ref Range Status  12/13/2023 239 20 - 275 mg/dL Final         Passed - eGFR is 30 or above and within 360 days    GFR calc Af Amer  Date Value Ref Range Status  10/26/2016 >60 >60 mL/min Final    Comment:    (NOTE) The eGFR has been calculated using the CKD EPI equation. This calculation has not been validated in all clinical situations. eGFR's persistently <60 mL/min signify possible Chronic Kidney Disease.    EGFR (Non-African Amer.)  Date Value Ref Range Status  10/24/2016 74.187  Final   GFR calc non Af Amer  Date Value Ref Range Status  10/26/2016 >60 >60 mL/min Final   GFR  Date Value Ref Range Status  10/08/2017 78.00 >60.00 mL/min Final   eGFR  Date Value Ref Range Status  10/10/2023 103 > OR = 60 mL/min/1.23m2 Final         Passed - Completed PHQ-2 or PHQ-9 in the last 360 days      Passed - Last BP in normal range    BP Readings from Last 1 Encounters:  01/03/24 102/82         Passed - Valid encounter within last 6 months    Recent Outpatient Visits           3 weeks ago Moderate episode of recurrent major depressive disorder [facility])   [facility] Gareth Mliss FALCON, FNP   1 month ago Moderate episode of recurrent major depressive disorder [facility])    [facility] Gareth Mliss F, FNP   2 months ago Moderate episode of recurrent major depressive disorder [facility])   [facility] Gareth Mliss F, FNP   3 months ago Moderate episode of recurrent major depressive disorder [facility])   [facility] Gareth Mliss FALCON, FNP       Future Appointments             In 2 months Szer, [place], DO [facility]umatology - [facility] [place]

## 2024-02-04 ENCOUNTER — Ambulatory Visit: Admitting: Nurse Practitioner

## 2024-02-13 ENCOUNTER — Other Ambulatory Visit: Payer: Self-pay

## 2024-02-13 ENCOUNTER — Ambulatory Visit: Admitting: Nurse Practitioner

## 2024-02-13 VITALS — BP 110/72 | HR 100 | Temp 98.5°F | Resp 16 | Ht 64.0 in | Wt 132.0 lb

## 2024-02-13 DIAGNOSIS — E538 Deficiency of other specified B group vitamins: Secondary | ICD-10-CM | POA: Diagnosis not present

## 2024-02-13 DIAGNOSIS — M797 Fibromyalgia: Secondary | ICD-10-CM | POA: Diagnosis not present

## 2024-02-13 DIAGNOSIS — F419 Anxiety disorder, unspecified: Secondary | ICD-10-CM

## 2024-02-13 DIAGNOSIS — R002 Palpitations: Secondary | ICD-10-CM

## 2024-02-13 DIAGNOSIS — F331 Major depressive disorder, recurrent, moderate: Secondary | ICD-10-CM

## 2024-02-13 DIAGNOSIS — R5383 Other fatigue: Secondary | ICD-10-CM

## 2024-02-13 MED ORDER — DULOXETINE HCL 30 MG PO CPEP: 30.0000 mg | ORAL_CAPSULE | Freq: Every day | ORAL | 0 refills | Status: AC

## 2024-02-13 NOTE — Progress Notes (Signed)
 "  BP 110/72 (Cuff Size: Normal)   Pulse 100   Temp 98.5 F (36.9 C) (Oral)   Resp 16   Ht 5' 4 (1.626 m)   Wt 132 lb (59.9 kg)   BMI 22.66 kg/m    Subjective:    Patient ID: Hannah Roman, female    DOB: March 20, 1995, 28 y.o.   MRN: [id number]  HPI: Hannah Roman is a 28 y.o. female  Chief Complaint  Patient presents with   Depression    4 week follow up   Discussed the use of AI scribe software for clinical note transcription with the patient, who gave verbal consent to proceed.  History of Present Illness Hannah Roman is a 28 year old female with depression and anxiety who presents for a four-week follow-up.  Mood disturbance and antidepressant therapy - Depression and anxiety managed with recent switch from Prozac  to Cymbalta  30 mg daily due to initiation of Plaquenil  for connective tissue disease - Difficulty assessing effects of new medications - Perceives Cymbalta  may not be improving mood - Concerned about increasing Cymbalta  dose due to potential side effects, especially in context of Plaquenil -related discomfort  Fibromyalgia and neuropathic pain - History of fibromyalgia with associated nerve pain - No significant change in pain levels since starting Cymbalta   Adverse effects of plaquenil  - Started Plaquenil  shortly after last visit (around Thanksgiving) - Experiencing nausea and headaches since starting Plaquenil  - Persistent fatigue despite taking over-the-counter B12 supplements - Previously had poor response to B12 injections - Recent B12 level was 378  Palpitations - Experiences palpitations, previously more prominent while on Vyvanse, which was discontinued - Occasional palpitations persist, particularly during symptom flares - Concern about palpitations being related to anxiety or other underlying issues  Menstrual changes and sleep quality - Recent menstrual periods lighter than usual - No snoring at night - Continues to feel tired  despite adequate sleep         02/13/2024    7:45 AM 01/03/2024    8:35 AM 12/10/2023    7:52 AM  Depression screen PHQ 2/9  Decreased Interest 1 1 0  Down, Depressed, Hopeless 1 1 0  PHQ - 2 Score 2 2 0  Altered sleeping 2 2 0  Tired, decreased energy 3 2 0  Change in appetite 0 0 0  Feeling bad or failure about yourself  0 0 0  Trouble concentrating 2 2 0  Moving slowly or fidgety/restless 1 1 0  Suicidal thoughts 0 0 0  PHQ-9 Score 10 9 0   Difficult doing work/chores Somewhat difficult Somewhat difficult Not difficult at all     Data saved with a previous flowsheet row definition       02/13/2024    7:46 AM 01/03/2024    8:35 AM 12/10/2023    7:53 AM 11/14/2023    7:50 AM  GAD 7 : Generalized Anxiety Score  Nervous, Anxious, on Edge 1 0 0 1  Control/stop worrying 1 1 0 0  Worry too much - different things 1 1 0 1  Trouble relaxing 2 1 0 1  Restless 1 1 0 0  Easily annoyed or irritable 1 2 0 1  Afraid - awful might happen 0 0 0 0  Total GAD 7 Score 7 6 0 4  Anxiety Difficulty Somewhat difficult Not difficult at all Not difficult at all Somewhat difficult     Relevant past medical, surgical, family and social history reviewed and updated as indicated. Interim medical history  since our last visit reviewed. Allergies and medications reviewed and updated.  Review of Systems  Ten systems reviewed and is negative except as mentioned in HPI      Objective:      BP 110/72 (Cuff Size: Normal)   Pulse 100   Temp 98.5 F (36.9 C) (Oral)   Resp 16   Ht 5' 4 (1.626 m)   Wt 132 lb (59.9 kg)   BMI 22.66 kg/m    Wt Readings from Last 3 Encounters:  02/13/24 132 lb (59.9 kg)  01/03/24 130 lb (59 kg)  12/25/23 130 lb 6.4 oz (59.1 kg)    Physical Exam GENERAL: Alert, cooperative, well developed, no acute distress HEENT: Normocephalic, normal oropharynx, moist mucous membranes CHEST: Clear to auscultation bilaterally, no wheezes, rhonchi, or  crackles CARDIOVASCULAR: Normal heart rate and rhythm, S1 and S2 normal without murmurs, normal sinus rhythm on EKG ABDOMEN: Soft, non-tender, non-distended, without organomegaly, normal bowel sounds EXTREMITIES: No cyanosis or edema NEUROLOGICAL: Cranial nerves grossly intact, moves all extremities without gross motor or sensory deficit  Results for orders placed or performed in visit on 12/13/23  CK   Collection Time: 12/13/23  8:56 AM  Result Value Ref Range   Total CK 63 20 - 239 U/L  Aldolase   Collection Time: 12/13/23  8:56 AM  Result Value Ref Range   Aldolase 4.6 < OR = 8.1 U/L  CBC with Differential/Platelet   Collection Time: 12/13/23  8:56 AM  Result Value Ref Range   WBC 2.9 (L) 3.8 - 10.8 Thousand/uL   RBC 4.81 3.80 - 5.10 Million/uL   Hemoglobin 15.3 11.7 - 15.5 g/dL   HCT 54.8 (H) 64.9 - 54.9 %   MCV 93.8 80.0 - 100.0 fL   MCH 31.8 27.0 - 33.0 pg   MCHC 33.9 32.0 - 36.0 g/dL   RDW 87.3 88.9 - 84.9 %   Platelets 155 140 - 400 Thousand/uL   MPV 11.8 7.5 - 12.5 fL   Neutro Abs 1,766 1,500 - 7,800 cells/uL   Absolute Lymphocytes 792 (L) 850 - 3,900 cells/uL   Absolute Monocytes 302 200 - 950 cells/uL   Eosinophils Absolute 20 15 - 500 cells/uL   Basophils Absolute 20 0 - 200 cells/uL   Neutrophils Relative % 60.9 %   Total Lymphocyte 27.3 %   Monocytes Relative 10.4 %   Eosinophils Relative 0.7 %   Basophils Relative 0.7 %  AST   Collection Time: 12/13/23  8:56 AM  Result Value Ref Range   AST 18 10 - 30 U/L  ALT   Collection Time: 12/13/23  8:56 AM  Result Value Ref Range   ALT 14 6 - 29 U/L  Parvovirus B19 antibody, IgG and IgM   Collection Time: 12/13/23  8:56 AM  Result Value Ref Range   Parvovirus B19 IgG 7.1 (H) <0.9   Parvovirus B19 IgM 0.2 <0.9  Epstein-Barr virus VCA antibody panel   Collection Time: 12/13/23  8:56 AM  Result Value Ref Range   EBV VCA IgM <36.00 U/mL   EBV VCA IgG 54.80 (H) U/mL   EBV NA IgG 161.00 (H) U/mL    Interpretation    Centromere Antibodies   Collection Time: 12/13/23  8:56 AM  Result Value Ref Range   Centromere Ab Screen 2.9 POS (A) <1.0 NEG AI  Protein / creatinine ratio, urine   Collection Time: 12/13/23  8:56 AM  Result Value Ref Range   Creatinine, Urine 239 20 -  275 mg/dL   Protein/Creat Ratio 67 24 - 184 mg/g creat   Protein/Creatinine Ratio 0.067 0.024 - 0.184 mg/mg creat   Total Protein, Urine 16 5 - 24 mg/dL  Creatinine, serum   Collection Time: 12/13/23  8:56 AM  Result Value Ref Range   Creat 0.78 0.50 - 0.96 mg/dL          Assessment & Plan:   Problem List Items Addressed This Visit       Other   Anxiety   Relevant Medications   DULoxetine  (CYMBALTA ) 30 MG capsule   Moderate episode of recurrent major depressive disorder (HCC) - Primary   Relevant Medications   DULoxetine  (CYMBALTA ) 30 MG capsule   Fibromyalgia   Relevant Medications   DULoxetine  (CYMBALTA ) 30 MG capsule   Other Visit Diagnoses       Other fatigue       Relevant Orders   CBC with Differential/Platelet   Comprehensive metabolic panel with GFR   TSH   Vitamin B12   Iron, TIBC and Ferritin Panel   EKG 12-Lead   LONG TERM MONITOR (3-14 DAYS)     B12 deficiency       Relevant Orders   Vitamin B12     Palpitations       Relevant Orders   CBC with Differential/Platelet   Comprehensive metabolic panel with GFR   TSH   Iron, TIBC and Ferritin Panel   EKG 12-Lead   LONG TERM MONITOR (3-14 DAYS)        Assessment and Plan Assessment & Plan Major depressive disorder, recurrent, moderate Currently on Cymbalta  30 mg daily. Transitioned from Prozac  due to initiation of Plaquenil  for connective tissue disease. Mood appears stable, but it is too early to determine full efficacy of Cymbalta . Concerns about increasing Cymbalta  due to potential side effects, especially in conjunction with Plaquenil . - Continue Cymbalta  30 mg daily. - Consider increasing Cymbalta  dose if no improvement  after holiday period. - If dose is increased, will schedule follow-up in four weeks.  Anxiety disorder Anxiety appears stable with current treatment regimen. No significant changes reported. - Continue current management.  Fibromyalgia No significant change in pain symptoms. Cymbalta  may help with nerve pain associated with fibromyalgia, but it is too early to assess full benefit. - Continue current management.  Palpitations Intermittent palpitations, possibly exacerbated by anxiety or medication. Last EKG was in 2018. Consideration of heart monitor due to nocturnal palpitations. - Ordered EKG to assess current heart rhythm. Which was NSR - Will consider heart monitor (Zio patch) if EKG is inconclusive or if palpitations persist.  Vitamin B12 deficiency Previous B12 level was 378, which is low but not critically low. Oral B12 supplementation may not be well absorbed. Previous B12 injections did not yield significant improvement. - Continue sublingual B12 supplementation. - Increase dietary intake of B12-rich foods.  Fatigue Persistent fatigue despite B12 supplementation. Thyroid  function is normal. Fatigue may be multifactorial, including potential B12 deficiency and other underlying conditions. - Continue to monitor fatigue symptoms. - Reassess B12 levels and supplementation strategy.        Follow up plan: Return in about 3 months (around 05/13/2024) for follow up. "

## 2024-02-14 LAB — COMPREHENSIVE METABOLIC PANEL WITH GFR
AG Ratio: 1.8 (calc) (ref 1.0–2.5)
ALT: 8 U/L (ref 6–29)
AST: 13 U/L (ref 10–30)
Albumin: 4.3 g/dL (ref 3.6–5.1)
Alkaline phosphatase (APISO): 63 U/L (ref 31–125)
BUN: 12 mg/dL (ref 7–25)
CO2: 27 mmol/L (ref 20–32)
Calcium: 9.4 mg/dL (ref 8.6–10.2)
Chloride: 106 mmol/L (ref 98–110)
Creat: 0.78 mg/dL (ref 0.50–0.96)
Globulin: 2.4 g/dL (ref 1.9–3.7)
Glucose, Bld: 80 mg/dL (ref 65–99)
Potassium: 3.9 mmol/L (ref 3.5–5.3)
Sodium: 140 mmol/L (ref 135–146)
Total Bilirubin: 0.7 mg/dL (ref 0.2–1.2)
Total Protein: 6.7 g/dL (ref 6.1–8.1)
eGFR: 106 mL/min/1.73m2

## 2024-02-14 LAB — VITAMIN B12: Vitamin B-12: 335 pg/mL (ref 200–1100)

## 2024-02-14 LAB — CBC WITH DIFFERENTIAL/PLATELET
Absolute Lymphocytes: 1533 {cells}/uL (ref 850–3900)
Absolute Monocytes: 621 {cells}/uL (ref 200–950)
Basophils Absolute: 39 {cells}/uL (ref 0–200)
Basophils Relative: 0.4 %
Eosinophils Absolute: 175 {cells}/uL (ref 15–500)
Eosinophils Relative: 1.8 %
HCT: 42 % (ref 35.9–46.0)
Hemoglobin: 13.6 g/dL (ref 11.7–15.5)
MCH: 30.7 pg (ref 27.0–33.0)
MCHC: 32.4 g/dL (ref 31.6–35.4)
MCV: 94.8 fL (ref 81.4–101.7)
MPV: 11.5 fL (ref 7.5–12.5)
Monocytes Relative: 6.4 %
Neutro Abs: 7333 {cells}/uL (ref 1500–7800)
Neutrophils Relative %: 75.6 %
Platelets: 182 Thousand/uL (ref 140–400)
RBC: 4.43 Million/uL (ref 3.80–5.10)
RDW: 12.4 % (ref 11.0–15.0)
Total Lymphocyte: 15.8 %
WBC: 9.7 Thousand/uL (ref 3.8–10.8)

## 2024-02-14 LAB — IRON,TIBC AND FERRITIN PANEL
%SAT: 25 % (ref 16–45)
Ferritin: 25 ng/mL (ref 16–154)
Iron: 70 ug/dL (ref 40–190)
TIBC: 279 ug/dL (ref 250–450)

## 2024-02-14 LAB — TSH: TSH: 0.83 m[IU]/L

## 2024-02-15 ENCOUNTER — Ambulatory Visit: Payer: Self-pay | Admitting: Nurse Practitioner

## 2024-04-09 ENCOUNTER — Ambulatory Visit: Admitting: Physician Assistant

## 2024-04-24 ENCOUNTER — Ambulatory Visit

## 2024-06-09 ENCOUNTER — Ambulatory Visit: Admitting: Nurse Practitioner
